# Patient Record
Sex: Female | Born: 2000 | Race: Black or African American | Hispanic: No | Marital: Single | Smoking: Never smoker
Health system: Southern US, Community
[De-identification: ages and names within clinical notes are randomized; demographics above are authoritative.]

## PROBLEM LIST (undated history)

## (undated) ENCOUNTER — Emergency Department (HOSPITAL_BASED_OUTPATIENT_CLINIC_OR_DEPARTMENT_OTHER): Admission: EM | Payer: Medicaid Other | Source: Home / Self Care

## (undated) ENCOUNTER — Inpatient Hospital Stay (HOSPITAL_COMMUNITY): Payer: Self-pay

## (undated) ENCOUNTER — Ambulatory Visit: Payer: Self-pay | Source: Home / Self Care

## (undated) DIAGNOSIS — O24419 Gestational diabetes mellitus in pregnancy, unspecified control: Secondary | ICD-10-CM

## (undated) DIAGNOSIS — W3400XA Accidental discharge from unspecified firearms or gun, initial encounter: Secondary | ICD-10-CM

## (undated) DIAGNOSIS — Z789 Other specified health status: Secondary | ICD-10-CM

## (undated) HISTORY — PX: NO PAST SURGERIES: SHX2092

---
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
## 2019-01-04 DIAGNOSIS — Z5181 Encounter for therapeutic drug level monitoring: Secondary | ICD-10-CM | POA: Diagnosis not present

## 2019-01-09 DIAGNOSIS — Z5181 Encounter for therapeutic drug level monitoring: Secondary | ICD-10-CM | POA: Diagnosis not present

## 2019-01-16 DIAGNOSIS — Z5181 Encounter for therapeutic drug level monitoring: Secondary | ICD-10-CM | POA: Diagnosis not present

## 2019-01-18 ENCOUNTER — Encounter (HOSPITAL_BASED_OUTPATIENT_CLINIC_OR_DEPARTMENT_OTHER): Payer: Self-pay | Admitting: *Deleted

## 2019-01-18 ENCOUNTER — Emergency Department (HOSPITAL_BASED_OUTPATIENT_CLINIC_OR_DEPARTMENT_OTHER)
Admission: EM | Admit: 2019-01-18 | Discharge: 2019-01-19 | Disposition: A | Discharge status: Home / Self Care | Payer: Medicaid Other | Attending: Emergency Medicine | Admitting: Emergency Medicine

## 2019-01-18 ENCOUNTER — Other Ambulatory Visit: Payer: Self-pay

## 2019-01-18 DIAGNOSIS — B9789 Other viral agents as the cause of diseases classified elsewhere: Secondary | ICD-10-CM | POA: Diagnosis not present

## 2019-01-18 DIAGNOSIS — J069 Acute upper respiratory infection, unspecified: Secondary | ICD-10-CM

## 2019-01-18 DIAGNOSIS — R05 Cough: Secondary | ICD-10-CM | POA: Diagnosis present

## 2019-01-18 NOTE — ED Triage Notes (Signed)
Pt c/o URi symptoms x 3 days  

## 2019-01-18 NOTE — ED Provider Notes (Signed)
[facility] Provider Note: Lowella Dell, MD, FACEP  CSN: [id number] MRN: [id number] ARRIVAL: 01/18/19 at 2326 ROOM: MH11/MH11   CHIEF COMPLAINT  URI   HISTORY OF PRESENT ILLNESS  01/18/19 11:44 PM Beth Valdez is a 18 y.o. female with a 3-day history of URI symptoms.  Specifically she has had a sore throat, cough, nasal congestion, and rhinorrhea.  The cough is nonproductive.  She denies shortness of breath, fever, body aches, loss of taste or smell, nausea, vomiting or diarrhea.  Symptoms are mild to moderate.  She has been taking Benadryl without improvement.  She is currently breast-feeding.   Past Medical History:  Diagnosis Date  . Gestational diabetes     Past Surgical History:  Procedure Laterality Date  . NO PAST SURGERIES      Family History  Problem Relation Age of Onset  . Hypertension Other   . Hypertension Maternal Grandfather     Social History   Tobacco Use  . Smoking status: Never Smoker  . Smokeless tobacco: Never Used  Substance Use Topics  . Alcohol use: Not Currently    Comment: socially  . Drug use: Not Currently    Types: Marijuana    Comment: Prior to pregnancy    Prior to Admission medications   Medication Sig Start Date End Date Taking? Authorizing Provider  dextromethorphan 15 MG/5ML syrup Take 10 mLs (30 mg total) by mouth 4 (four) times daily as needed for cough. 01/19/19   Lashonda Sonneborn, MD  ibuprofen (ADVIL) 800 MG tablet Take 1 tablet (800 mg total) by mouth every 8 (eight) hours as needed for fever or moderate pain. 01/19/19   Jozey Janco, Jonny Ruiz, MD    Allergies Patient has no known allergies.   REVIEW OF SYSTEMS  Negative except as noted here or in the History of Present Illness.   PHYSICAL EXAMINATION  Initial Vital Signs Blood pressure 121/79, pulse 89, temperature 98.5 F (36.9 C), resp. rate 16, height 5\' 8"  (1.727 m), weight 120.2 kg, last menstrual period 01/18/2019, SpO2 100 %, currently breastfeeding.   Examination General: Well-developed, well-nourished female in no acute distress; appearance consistent with age of record HENT: normocephalic; atraumatic Eyes: pupils equal, round and reactive to light; extraocular muscles intact Neck: supple Heart: regular rate and rhythm Lungs: clear to auscultation bilaterally Abdomen: soft; nondistended; nontender;  bowel sounds present Extremities: No deformity; full range of motion Neurologic: Awake, alert and oriented; motor function intact in all extremities and symmetric; no facial droop Skin: Warm and dry Psychiatric: Normal mood and affect   RESULTS  Summary of this visit's results, reviewed and interpreted by myself:   EKG Interpretation  Date/Time:    Ventricular Rate:    PR Interval:    QRS Duration:   QT Interval:    QTC Calculation:   R Axis:     Text Interpretation:        Laboratory Studies: No results found for this or any previous visit (from the past 24 hour(s)). Imaging Studies: No results found.  ED COURSE and MDM  Nursing notes, initial and subsequent vitals signs, including pulse oximetry, reviewed and interpreted by myself.  Vitals:   01/18/19 2336 01/18/19 2338  BP:  121/79  Pulse:  89  Resp:  16  Temp:  98.5 F (36.9 C)  SpO2:  100%  Weight: 120.2 kg   Height: 5\' 8"  (1.727 m)    Medications  ibuprofen (ADVIL) tablet 800 mg (has no administration in time range)  guaiFENesin (  ROBITUSSIN) 100 MG/5ML solution 200 mg (has no administration in time range)    We will prescribe ibuprofen for sore throat.  Dextromethorphan appears safe in lactation.  PROCEDURES  Procedures   ED DIAGNOSES     ICD-10-CM   1. Viral URI with cough  J06.9        Moustafa Mossa, MD 01/19/19 5929

## 2019-01-19 DIAGNOSIS — B9789 Other viral agents as the cause of diseases classified elsewhere: Secondary | ICD-10-CM | POA: Diagnosis not present

## 2019-01-19 DIAGNOSIS — J069 Acute upper respiratory infection, unspecified: Secondary | ICD-10-CM | POA: Diagnosis not present

## 2019-01-19 MED ORDER — DEXTROMETHORPHAN HBR 15 MG/5ML PO SYRP: 10.0000 mL | ORAL_SOLUTION | Freq: Four times a day (QID) | ORAL | 0 refills | Status: DC | PRN

## 2019-01-19 MED ORDER — IBUPROFEN 800 MG PO TABS: 800.0000 mg | ORAL_TABLET | Freq: Three times a day (TID) | ORAL | 0 refills | Status: DC | PRN

## 2019-01-19 MED ORDER — GUAIFENESIN 100 MG/5ML PO SOLN
10.0000 mL | Freq: Once | ORAL | Status: AC
  Administered 2019-01-19: 200 mg via ORAL
  Filled 2019-01-19: qty 10

## 2019-01-19 MED ORDER — IBUPROFEN 800 MG PO TABS
800.0000 mg | ORAL_TABLET | Freq: Once | ORAL | Status: AC
  Administered 2019-01-19: 800 mg via ORAL
  Filled 2019-01-19: qty 1

## 2019-01-23 DIAGNOSIS — Z5181 Encounter for therapeutic drug level monitoring: Secondary | ICD-10-CM | POA: Diagnosis not present

## 2019-01-30 DIAGNOSIS — Z5181 Encounter for therapeutic drug level monitoring: Secondary | ICD-10-CM | POA: Diagnosis not present

## 2019-02-06 DIAGNOSIS — Z5181 Encounter for therapeutic drug level monitoring: Secondary | ICD-10-CM | POA: Diagnosis not present

## 2019-02-13 DIAGNOSIS — Z5181 Encounter for therapeutic drug level monitoring: Secondary | ICD-10-CM | POA: Diagnosis not present

## 2019-02-27 DIAGNOSIS — Z5181 Encounter for therapeutic drug level monitoring: Secondary | ICD-10-CM | POA: Diagnosis not present

## 2019-03-02 ENCOUNTER — Other Ambulatory Visit: Payer: Self-pay

## 2019-03-02 ENCOUNTER — Ambulatory Visit (INDEPENDENT_AMBULATORY_CARE_PROVIDER_SITE_OTHER): Payer: Medicaid Other

## 2019-03-02 ENCOUNTER — Other Ambulatory Visit (HOSPITAL_COMMUNITY)
Admission: RE | Admit: 2019-03-02 | Discharge: 2019-03-02 | Disposition: A | Discharge status: Home / Self Care | Payer: Medicaid Other | Source: Ambulatory Visit | Attending: Family Medicine | Admitting: Family Medicine

## 2019-03-02 DIAGNOSIS — N898 Other specified noninflammatory disorders of vagina: Secondary | ICD-10-CM | POA: Diagnosis not present

## 2019-03-02 NOTE — Progress Notes (Signed)
Pt here today for self-swab. Pt reports hx of gonorrhea and chlamydia. Reports recent hx of thick, white discharge and current vaginal itching. Self-swab instructions given and specimen obtained. Explained to pt we will only call with abnormal results.  Pt expresses interest in beginning birth control. Has not been seen by a provider since delivery August 2020. Encouraged pt to make an appt with a provider to discuss birth control options before leaving the office. Handouts given to patient with some options to consider for birth control prior to appt.  Fleet Contras RN 03/02/19

## 2019-03-02 NOTE — Progress Notes (Signed)
Patient seen and assessed by nursing staff during this encounter. I have reviewed the chart and agree with the documentation and plan.  Ladye Macnaughton, MD 03/02/2019 3:59 PM    

## 2019-03-03 LAB — CERVICOVAGINAL ANCILLARY ONLY
Bacterial Vaginitis (gardnerella): POSITIVE — AB
Candida Glabrata: NEGATIVE
Candida Vaginitis: NEGATIVE
Chlamydia: NEGATIVE
Comment: NEGATIVE
Comment: NEGATIVE
Comment: NEGATIVE
Comment: NEGATIVE
Comment: NEGATIVE
Comment: NORMAL
Neisseria Gonorrhea: NEGATIVE
Trichomonas: NEGATIVE

## 2019-03-04 ENCOUNTER — Other Ambulatory Visit: Payer: Self-pay | Admitting: Obstetrics and Gynecology

## 2019-03-04 MED ORDER — METRONIDAZOLE 500 MG PO TABS: 500.0000 mg | ORAL_TABLET | Freq: Two times a day (BID) | ORAL | 0 refills | Status: DC

## 2019-03-06 DIAGNOSIS — Z5181 Encounter for therapeutic drug level monitoring: Secondary | ICD-10-CM | POA: Diagnosis not present

## 2019-03-13 DIAGNOSIS — Z5181 Encounter for therapeutic drug level monitoring: Secondary | ICD-10-CM | POA: Diagnosis not present

## 2019-03-20 DIAGNOSIS — Z5181 Encounter for therapeutic drug level monitoring: Secondary | ICD-10-CM | POA: Diagnosis not present

## 2019-03-21 ENCOUNTER — Inpatient Hospital Stay (EMERGENCY_DEPARTMENT_HOSPITAL)
Admission: AD | Admit: 2019-03-21 | Discharge: 2019-03-21 | Disposition: A | Discharge status: Home / Self Care | Payer: Medicaid Other | Source: Home / Self Care | Attending: Family Medicine | Admitting: Family Medicine

## 2019-03-21 ENCOUNTER — Other Ambulatory Visit: Payer: Self-pay

## 2019-03-21 ENCOUNTER — Emergency Department (HOSPITAL_COMMUNITY)
Admission: EM | Admit: 2019-03-21 | Discharge: 2019-03-21 | Discharge status: Against Medical Advice | Payer: Medicaid Other | Attending: Emergency Medicine | Admitting: Emergency Medicine

## 2019-03-21 ENCOUNTER — Encounter (HOSPITAL_COMMUNITY): Payer: Self-pay | Admitting: Family Medicine

## 2019-03-21 ENCOUNTER — Encounter (HOSPITAL_COMMUNITY): Payer: Self-pay | Admitting: Emergency Medicine

## 2019-03-21 DIAGNOSIS — Z3A01 Less than 8 weeks gestation of pregnancy: Secondary | ICD-10-CM | POA: Diagnosis not present

## 2019-03-21 DIAGNOSIS — R109 Unspecified abdominal pain: Secondary | ICD-10-CM

## 2019-03-21 DIAGNOSIS — R7303 Prediabetes: Secondary | ICD-10-CM | POA: Diagnosis not present

## 2019-03-21 DIAGNOSIS — O26891 Other specified pregnancy related conditions, first trimester: Secondary | ICD-10-CM | POA: Insufficient documentation

## 2019-03-21 DIAGNOSIS — Z3201 Encounter for pregnancy test, result positive: Secondary | ICD-10-CM | POA: Diagnosis not present

## 2019-03-21 LAB — URINALYSIS, ROUTINE W REFLEX MICROSCOPIC
Bilirubin Urine: NEGATIVE
Glucose, UA: NEGATIVE mg/dL
Hgb urine dipstick: NEGATIVE
Ketones, ur: NEGATIVE mg/dL
Leukocytes,Ua: NEGATIVE
Nitrite: NEGATIVE
Protein, ur: NEGATIVE mg/dL
Specific Gravity, Urine: 1.027 (ref 1.005–1.030)
pH: 5 (ref 5.0–8.0)

## 2019-03-21 LAB — COMPREHENSIVE METABOLIC PANEL
ALT: 18 U/L (ref 0–44)
AST: 17 U/L (ref 15–41)
Albumin: 3.7 g/dL (ref 3.5–5.0)
Alkaline Phosphatase: 71 U/L (ref 38–126)
Anion gap: 10 (ref 5–15)
BUN: 8 mg/dL (ref 6–20)
CO2: 24 mmol/L (ref 22–32)
Calcium: 9.2 mg/dL (ref 8.9–10.3)
Chloride: 104 mmol/L (ref 98–111)
Creatinine, Ser: 0.76 mg/dL (ref 0.44–1.00)
GFR calc Af Amer: >60 mL/min (ref >60)
GFR calc non Af Amer: >60 mL/min (ref >60)
Glucose, Bld: 116 mg/dL — ABNORMAL HIGH (ref 70–99)
Potassium: 4 mmol/L (ref 3.5–5.1)
Sodium: 138 mmol/L (ref 135–145)
Total Bilirubin: 0.3 mg/dL (ref 0.3–1.2)
Total Protein: 7.4 g/dL (ref 6.5–8.1)

## 2019-03-21 LAB — I-STAT BETA HCG BLOOD, ED (MC, WL, AP ONLY): I-stat hCG, quantitative: 18.4 m[IU]/mL — ABNORMAL HIGH (ref <5)

## 2019-03-21 LAB — CBC
HCT: 43.3 % (ref 36.0–46.0)
Hemoglobin: 12.9 g/dL (ref 12.0–15.0)
MCH: 20.9 pg — ABNORMAL LOW (ref 26.0–34.0)
MCHC: 29.8 g/dL — ABNORMAL LOW (ref 30.0–36.0)
MCV: 70.1 fL — ABNORMAL LOW (ref 80.0–100.0)
Platelets: 399 10*3/uL (ref 150–400)
RBC: 6.18 MIL/uL — ABNORMAL HIGH (ref 3.87–5.11)
RDW: 15.7 % — ABNORMAL HIGH (ref 11.5–15.5)
WBC: 8.1 10*3/uL (ref 4.0–10.5)
nRBC: 0 % (ref 0.0–0.2)

## 2019-03-21 LAB — LIPASE, BLOOD: Lipase: 21 U/L (ref 11–51)

## 2019-03-21 NOTE — ED Provider Notes (Addendum)
[facility] EMERGENCY DEPARTMENT Provider Note   CSN: [id number] Arrival date & time: 03/21/19  1353     History Chief Complaint  Patient presents with  . Abdominal Pain    Beth Valdez is a 19 y.o. female.  HPI     Past Medical History:  Diagnosis Date  . Gestational diabetes     Patient Active Problem List   Diagnosis Date Noted  . Indication for care in labor or delivery 09/30/2018  . Gestational diabetes mellitus (GDM) affecting pregnancy 04/20/2018  . Prediabetes 04/01/2018  . Supervision of normal first teen pregnancy 03/22/2018  . Chlamydia trachomatis infection during pregnancy in first trimester, antepartum 03/02/2018    Past Surgical History:  Procedure Laterality Date  . NO PAST SURGERIES       OB History    Gravida  2   Para  1   Term  1   Preterm      AB      Living  1     SAB      TAB      Ectopic      Multiple  0   Live Births  1           Family History  Problem Relation Age of Onset  . Hypertension Other   . Hypertension Maternal Grandfather     Social History   Tobacco Use  . Smoking status: Never Smoker  . Smokeless tobacco: Never Used  Substance Use Topics  . Alcohol use: Not Currently    Comment: socially  . Drug use: Not Currently    Types: Marijuana    Comment: Prior to pregnancy    Home Medications Prior to Admission medications   Not on File    Allergies    Patient has no known allergies.  Review of Systems   Review of Systems  Physical Exam Updated Vital Signs BP 110/63 (BP Location: Right Arm)   Pulse 92   Temp 98.1 F (36.7 C) (Oral)   Resp 16   Ht 5\' 8"  (1.727 m)   Wt 117.9 kg   LMP 02/17/2019   SpO2 100%   BMI 39.53 kg/m   Physical Exam  ED Results / Procedures / Treatments   Labs (all labs ordered are listed, but only abnormal results are displayed) Labs Reviewed  COMPREHENSIVE METABOLIC PANEL - Abnormal; Notable for the following components:   Result Value   Glucose, Bld 116 (*)    All other components within normal limits  CBC - Abnormal; Notable for the following components:   RBC 6.18 (*)    MCV 70.1 (*)    MCH 20.9 (*)    MCHC 29.8 (*)    RDW 15.7 (*)    All other components within normal limits  I-STAT BETA HCG BLOOD, ED ([facility], [facility], [facility] ONLY) - Abnormal; Notable for the following components:   I-stat hCG, quantitative 18.4 (*)    All other components within normal limits  LIPASE, BLOOD  URINALYSIS, ROUTINE W REFLEX MICROSCOPIC    EKG None  Radiology No results found.  Procedures Procedures (including critical care time)  Medications Ordered in ED Medications - No data to display  ED Course  I have reviewed the triage vital signs and the nursing notes.  Pertinent labs & imaging results that were available during my care of the patient were reviewed by me and considered in my medical decision making (see chart for details).  Clinical Course as of Feb  08 O'[place] Mar 21, 2019  1602 Patient signed out AMA prior to me being able to see the patient.   [KM]    Clinical Course User Index [KM] Kristine Royal   MDM Rules/Calculators/A&P                       Final Clinical Impression(s) / ED Diagnoses Final diagnoses:  Abdominal pain, unspecified abdominal location    Rx / DC Orders ED Discharge Orders    None       Kristine Royal 03/21/19 1604    Maudie Flakes, MD 03/24/19 0131    Alveria Apley, PA-C 03/27/19 0815    Maudie Flakes, MD 03/30/19 [phone]

## 2019-03-21 NOTE — MAU Provider Note (Signed)
First Provider Initiated Contact with Patient 03/21/19 2105     S Ms. Beth Valdez is a 19 y.o. [id number] pregnant female who presents to MAU today requesting confirmation of pregnancy. She was seen in [facility] ED earlier today and blood test positive for pregnancy but these results were reportedly not shared with the patient. She reports some nausea in the mornings but otherwise denies vaginal bleeding, abdominal pain, urinary symptoms, vaginal discharge or any other concern.   O BP 122/63 (BP Location: Right Arm)   Pulse 88   Temp 98.5 F (36.9 C) (Oral)   Resp 20   Ht 5\' 8"  (1.727 m)   Wt 119.3 kg   LMP 02/17/2019   BMI 39.99 kg/m  Physical Exam CONSTITUTIONAL: Well-developed, well-nourished female in no acute distress.  HEENT:  Normocephalic, atraumatic. External right and left ear normal. No scleral icterus.  NECK: Normal range of motion SKIN: No rash noted. Not diaphoretic. No erythema. No pallor. MUSCULOSKELETAL: Normal range of motion. No edema noted. NEUROLOGIC: Alert and oriented to person, place, and time. Normal muscle tone coordination. No cranial nerve deficit noted. PSYCHIATRIC: Normal mood and affect. Normal behavior. Normal judgment and thought content. CARDIOVASCULAR: Normal heart rate noted RESPIRATORY: Effort  normal, no problems with respiration noted ABDOMEN: Soft, non-tender.  A Pregnant female at approx [redacted]w[redacted]d by LMP. Medical screening exam complete Early pregnancy.   P Discharge from MAU in stable condition Advised to f/u with [facility] as she did with last pregnancy should she choose to continue with the pregnancy. Concern for bleeding, pain or any other concern.  Warning signs for worsening condition that would warrant emergency follow-up discussed Patient may return to MAU as needed for pregnancy related complaints  Kamylah Manzo, [redacted]w[redacted]d, MD 03/21/2019 9:11 PM

## 2019-03-21 NOTE — ED Notes (Signed)
Pt requests to leave. Pt advised that she has not received her test results or seen a provider at this time. Pt verbalizes understanding. Education provided on the risks of leaving AMA. Pt verbalizes understanding of these risks. Pt is alert and oriented x4 and ambulatory at time of leaving.

## 2019-03-21 NOTE — MAU Note (Signed)
PT SAYS  FEELS NAUSEA, VOIDING  A LOT , MANY BM'S, . HPT - YESTERDAY - 1 NEG  AND 2 POSITIVE.  NO BC. LAST SEX-  2 WEEKS AG.   SHE ALSO DID HPT ON 17TH - WAS NEG

## 2019-03-21 NOTE — ED Triage Notes (Signed)
Pt endorses lower abd pain for a week and nausea.

## 2019-04-05 ENCOUNTER — Ambulatory Visit: Payer: Medicaid Other | Admitting: Obstetrics & Gynecology

## 2019-04-24 ENCOUNTER — Ambulatory Visit: Payer: Medicaid Other | Admitting: Family Medicine

## 2019-04-26 ENCOUNTER — Encounter: Payer: Self-pay | Admitting: Family Medicine

## 2019-05-24 ENCOUNTER — Ambulatory Visit: Payer: Medicaid Other | Admitting: Family Medicine

## 2019-08-17 DIAGNOSIS — Z419 Encounter for procedure for purposes other than remedying health state, unspecified: Secondary | ICD-10-CM | POA: Diagnosis not present

## 2019-08-29 ENCOUNTER — Other Ambulatory Visit: Payer: Self-pay

## 2019-08-29 ENCOUNTER — Ambulatory Visit (HOSPITAL_COMMUNITY)
Admission: EM | Admit: 2019-08-29 | Discharge: 2019-08-29 | Disposition: A | Discharge status: Home / Self Care | Payer: Medicaid Other

## 2019-08-29 DIAGNOSIS — S52502D Unspecified fracture of the lower end of left radius, subsequent encounter for closed fracture with routine healing: Secondary | ICD-10-CM | POA: Diagnosis not present

## 2019-08-29 DIAGNOSIS — S52502A Unspecified fracture of the lower end of left radius, initial encounter for closed fracture: Secondary | ICD-10-CM | POA: Diagnosis not present

## 2019-08-29 DIAGNOSIS — S62101A Fracture of unspecified carpal bone, right wrist, initial encounter for closed fracture: Secondary | ICD-10-CM

## 2019-08-29 HISTORY — DX: Fracture of unspecified carpal bone, right wrist, initial encounter for closed fracture: S62.101A

## 2019-08-29 NOTE — ED Notes (Signed)
Pt eval from waiting room 2/2 pt crying hysterically, saying "it hurts". Pt reports she fell PTA, denies head injury/LOC, c/o right wrist pain. Fingers warm to touch, capillary refill brisk, +2 radial pulse. Ice applied and comfort measures/reassurance given. Ice water given. Per H. Wieters, pt can return to waiting room and be evaluated/tx in order of time of arrival or go to [facility]. Pt chose to go to [facility] 2/2 wait time.

## 2019-08-30 DIAGNOSIS — S52502D Unspecified fracture of the lower end of left radius, subsequent encounter for closed fracture with routine healing: Secondary | ICD-10-CM | POA: Diagnosis not present

## 2019-08-31 ENCOUNTER — Encounter (HOSPITAL_BASED_OUTPATIENT_CLINIC_OR_DEPARTMENT_OTHER): Payer: Self-pay | Admitting: Orthopedic Surgery

## 2019-08-31 ENCOUNTER — Other Ambulatory Visit: Payer: Self-pay

## 2019-08-31 NOTE — Progress Notes (Signed)
Spoke w/ via phone for pre-op interview---pt Lab needs dos----   Urine poct            Lab results------none COVID test ------09-02-2019 at 1130 am Arrive at -------930 am 09-05-2019 NPO after MN NO Solid Food.  Clear liquids from MN until---730 am then npo Medications to take morning of surgery -----none Diabetic medication -----n/a Patient Special Instructions -----none Pre-Op special Istructions -----none Patient verbalized understanding of instructions that were given at this phone interview. Patient denies shortness of breath, chest pain, fever, cough a this phone interview.

## 2019-09-02 ENCOUNTER — Other Ambulatory Visit (HOSPITAL_COMMUNITY)
Admission: RE | Admit: 2019-09-02 | Discharge: 2019-09-02 | Disposition: A | Discharge status: Home / Self Care | Payer: Medicaid Other | Source: Ambulatory Visit | Attending: Orthopedic Surgery | Admitting: Orthopedic Surgery

## 2019-09-02 DIAGNOSIS — Z20822 Contact with and (suspected) exposure to covid-19: Secondary | ICD-10-CM | POA: Diagnosis not present

## 2019-09-02 LAB — SARS CORONAVIRUS 2 (TAT 6-24 HRS): SARS Coronavirus 2: NEGATIVE

## 2019-09-04 NOTE — Anesthesia Preprocedure Evaluation (Addendum)
Anesthesia Evaluation  Patient identified by MRN, date of birth, ID band Patient awake    Reviewed: Allergy & Precautions, NPO status , Patient's Chart, lab work & pertinent test results  Airway Mallampati: I  TM Distance: >3 FB Neck ROM: Full    Dental   Pulmonary neg pulmonary ROS,    Pulmonary exam normal        Cardiovascular Exercise Tolerance: Good negative cardio ROS Normal cardiovascular exam     Neuro/Psych negative neurological ROS  negative psych ROS   GI/Hepatic negative GI ROS, Neg liver ROS,   Endo/Other  diabetes  Renal/GU negative Renal ROS     Musculoskeletal negative musculoskeletal ROS (+)   Abdominal (+) + obese,   Peds  Hematology negative hematology ROS (+)   Anesthesia Other Findings   Reproductive/Obstetrics                            Anesthesia Physical Anesthesia Plan  ASA: II  Anesthesia Plan: Regional   Post-op Pain Management:    Induction:   PONV Risk Score and Plan: 3 and Treatment may vary due to age or medical condition and Midazolam  Airway Management Planned: Natural Airway and Nasal Cannula  Additional Equipment: None  Intra-op Plan:   Post-operative Plan:   Informed Consent: I have reviewed the patients History and Physical, chart, labs and discussed the procedure including the risks, benefits and alternatives for the proposed anesthesia with the patient or authorized representative who has indicated his/her understanding and acceptance.     Dental advisory given  Plan Discussed with: CRNA and Surgeon  Anesthesia Plan Comments: (R supra clavicular block w MAC)       Anesthesia Quick Evaluation

## 2019-09-04 NOTE — H&P (Signed)
PREOPERATIVE H&P  Chief Complaint: RIGHT WRIST FRACTURE  HPI: Beth Valdez is a 19 y.o. female who is scheduled for OPEN REDUCTION INTERNAL FIXATION (ORIF) WRIST FRACTURE.   Patient has a past medical history significant for gestational diabetes.   Patient was seen at [facility] on 08/29/2019 after falling onto her right outstretched hand. X-rays showed distal radius fracture. She was sent to Dr. Eulah Pont for surgical discussion.  Symptoms are rated as moderate to severe, and have been worsening.  This is significantly impairing activities of daily living.    Please see clinic note for further details on this patient's care.    She has elected for surgical management.   Past Medical History:  Diagnosis Date  . History of gestational diabetes 09/30/2018  . Right wrist fracture 08/29/2019   after fall   Past Surgical History:  Procedure Laterality Date  . NO PAST SURGERIES     Social History   Socioeconomic History  . Marital status: Single    Spouse name: Not on file  . Number of children: Not on file  . Years of education: Not on file  . Highest education level: Not on file  Occupational History  . Not on file  Tobacco Use  . Smoking status: Never Smoker  . Smokeless tobacco: Never Used  Vaping Use  . Vaping Use: Never used  Substance and Sexual Activity  . Alcohol use: Not Currently  . Drug use: Not Currently    Types: Marijuana    Comment: marijuana last used 08-24-2019  . Sexual activity: Yes    Partners: Male    Birth control/protection: None  Other Topics Concern  . 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 Difficulty of Paying Living Expenses:   Food Insecurity:   . Worried About Programme researcher, broadcasting/film/video in the Last Year:   . Barista in the Last Year:   Transportation Needs:   . Freight forwarder (Medical):   Marland Kitchen Lack of Transportation  (Non-Medical):   Physical Activity:   . Days of Exercise per Week:   . Minutes of Exercise per Session:   Stress:   . Feeling of Stress :   Social Connections:   . Frequency of Communication with Friends and Family:   . Frequency of Social Gatherings with Friends and Family:   . Attends Religious Services:   . Active Member of Clubs or Organizations:   . Attends Banker Meetings:   Marland Kitchen Marital Status:    Family History  Problem Relation Age of Onset  . Hypertension Other   . Hypertension Maternal Grandfather    No Known Allergies Prior to Admission medications   Medication Sig Start Date End Date Taking? Authorizing Provider  acetaminophen (TYLENOL) 500 MG tablet Take 1,000 mg by mouth every 6 (six) hours as needed.   Yes [provider]    ROS: All other systems have been reviewed and were otherwise negative with the exception of those mentioned in the HPI and as above.  Physical Exam: General: Alert, no acute distress Cardiovascular: No pedal edema Respiratory: No cyanosis, no use of accessory musculature GI: No organomegaly, abdomen is soft and non-tender Skin: No lesions in the area of chief complaint Neurologic: Sensation intact distally Psychiatric: Patient is competent for consent with normal mood and affect Lymphatic: No axillary or cervical lymphadenopathy  MUSCULOSKELETAL:  Right upper extremity: Splint CDI. Skin intact though cannot assess fully beneath splint. Nontender to palpation proximally. Painless passive motion of fingers. Well perfused digits.   Imaging: X-rays demonstrate displaced distal radius fracture with dorsal articular involvement with multiple pieces  Assessment: RIGHT WRIST FRACTURE  Plan: Plan for Procedure(s): OPEN REDUCTION INTERNAL FIXATION (ORIF) WRIST FRACTURE  The risks benefits and alternatives were discussed with the patient including but not limited to the risks of nonoperative treatment, versus surgical  intervention including infection, bleeding, nerve injury,  blood clots, cardiopulmonary complications, morbidity, mortality, among others, and they were willing to proceed.   The patient acknowledged the explanation, agreed to proceed with the plan and consent was signed.   Operative Plan: ORIF right wrist fracture Discharge Medications: Norco DVT Prophylaxis: None Physical Therapy: +/- outpatient OT Special Discharge needs: Splint. Sling for comfort.    Vernetta Honey, PA-C  09/04/2019 5:30 PM

## 2019-09-05 ENCOUNTER — Ambulatory Visit (HOSPITAL_BASED_OUTPATIENT_CLINIC_OR_DEPARTMENT_OTHER): Payer: Medicaid Other | Admitting: Registered Nurse

## 2019-09-05 ENCOUNTER — Encounter (HOSPITAL_BASED_OUTPATIENT_CLINIC_OR_DEPARTMENT_OTHER)
Admission: RE | Disposition: A | Discharge status: Home / Self Care | Payer: Self-pay | Source: Home / Self Care | Attending: Orthopedic Surgery

## 2019-09-05 ENCOUNTER — Ambulatory Visit (HOSPITAL_BASED_OUTPATIENT_CLINIC_OR_DEPARTMENT_OTHER)
Admission: RE | Admit: 2019-09-05 | Discharge: 2019-09-05 | Disposition: A | Discharge status: Home / Self Care | Payer: Medicaid Other | Attending: Orthopedic Surgery | Admitting: Orthopedic Surgery

## 2019-09-05 ENCOUNTER — Encounter (HOSPITAL_BASED_OUTPATIENT_CLINIC_OR_DEPARTMENT_OTHER): Payer: Self-pay | Admitting: Orthopedic Surgery

## 2019-09-05 ENCOUNTER — Other Ambulatory Visit: Payer: Self-pay

## 2019-09-05 DIAGNOSIS — E119 Type 2 diabetes mellitus without complications: Secondary | ICD-10-CM | POA: Insufficient documentation

## 2019-09-05 DIAGNOSIS — S52501A Unspecified fracture of the lower end of right radius, initial encounter for closed fracture: Secondary | ICD-10-CM | POA: Diagnosis not present

## 2019-09-05 DIAGNOSIS — S52502A Unspecified fracture of the lower end of left radius, initial encounter for closed fracture: Secondary | ICD-10-CM | POA: Diagnosis not present

## 2019-09-05 DIAGNOSIS — S62101A Fracture of unspecified carpal bone, right wrist, initial encounter for closed fracture: Secondary | ICD-10-CM | POA: Diagnosis not present

## 2019-09-05 DIAGNOSIS — S52571A Other intraarticular fracture of lower end of right radius, initial encounter for closed fracture: Secondary | ICD-10-CM | POA: Insufficient documentation

## 2019-09-05 DIAGNOSIS — W19XXXA Unspecified fall, initial encounter: Secondary | ICD-10-CM | POA: Diagnosis not present

## 2019-09-05 HISTORY — PX: ORIF WRIST FRACTURE: SHX2133

## 2019-09-05 LAB — POCT PREGNANCY, URINE: Preg Test, Ur: NEGATIVE

## 2019-09-05 SURGERY — OPEN REDUCTION INTERNAL FIXATION (ORIF) WRIST FRACTURE
Anesthesia: Regional | Site: Wrist | Laterality: Right

## 2019-09-05 MED ORDER — ACETAMINOPHEN 500 MG PO TABS
1000.0000 mg | ORAL_TABLET | Freq: Three times a day (TID) | ORAL | 0 refills | Status: AC
Start: 2019-09-05 — End: 2019-09-19

## 2019-09-05 MED ORDER — LACTATED RINGERS IV SOLN: INTRAVENOUS | Status: DC

## 2019-09-05 MED ORDER — MIDAZOLAM HCL 2 MG/2ML IJ SOLN
INTRAMUSCULAR | Status: AC
  Filled 2019-09-05: qty 2

## 2019-09-05 MED ORDER — OXYCODONE HCL 5 MG PO TABS: ORAL_TABLET | ORAL | 0 refills | Status: AC

## 2019-09-05 MED ORDER — FENTANYL CITRATE (PF) 100 MCG/2ML IJ SOLN
INTRAMUSCULAR | Status: AC
  Filled 2019-09-05: qty 2

## 2019-09-05 MED ORDER — ONDANSETRON HCL 4 MG/2ML IJ SOLN
INTRAMUSCULAR | Status: DC | PRN
  Administered 2019-09-05: 4 mg via INTRAVENOUS

## 2019-09-05 MED ORDER — ONDANSETRON HCL 4 MG/2ML IJ SOLN: 4.0000 mg | Freq: Once | INTRAMUSCULAR | Status: DC | PRN

## 2019-09-05 MED ORDER — GLYCOPYRROLATE 0.2 MG/ML IJ SOLN
INTRAMUSCULAR | Status: DC | PRN
  Administered 2019-09-05: .1 mg via INTRAVENOUS

## 2019-09-05 MED ORDER — DEXAMETHASONE SODIUM PHOSPHATE 10 MG/ML IJ SOLN
INTRAMUSCULAR | Status: AC
  Filled 2019-09-05: qty 1

## 2019-09-05 MED ORDER — PROPOFOL 500 MG/50ML IV EMUL
INTRAVENOUS | Status: AC
  Filled 2019-09-05: qty 50

## 2019-09-05 MED ORDER — FENTANYL CITRATE (PF) 100 MCG/2ML IJ SOLN
INTRAMUSCULAR | Status: DC | PRN
  Administered 2019-09-05 (×2): 50 ug via INTRAVENOUS

## 2019-09-05 MED ORDER — ONDANSETRON HCL 4 MG/2ML IJ SOLN
INTRAMUSCULAR | Status: AC
  Filled 2019-09-05: qty 2

## 2019-09-05 MED ORDER — DROPERIDOL 2.5 MG/ML IJ SOLN: 0.6250 mg | Freq: Once | INTRAMUSCULAR | Status: DC | PRN

## 2019-09-05 MED ORDER — LIDOCAINE HCL (CARDIAC) PF 100 MG/5ML IV SOSY
PREFILLED_SYRINGE | INTRAVENOUS | Status: DC | PRN
  Administered 2019-09-05: 50 mg via INTRAVENOUS

## 2019-09-05 MED ORDER — BUPIVACAINE-EPINEPHRINE (PF) 0.5% -1:200000 IJ SOLN
INTRAMUSCULAR | Status: DC | PRN
Start: 2019-09-05 — End: 2019-09-05
  Administered 2019-09-05: 30 mL via PERINEURAL

## 2019-09-05 MED ORDER — LIDOCAINE 2% (20 MG/ML) 5 ML SYRINGE
INTRAMUSCULAR | Status: AC
  Filled 2019-09-05: qty 5

## 2019-09-05 MED ORDER — PROPOFOL 10 MG/ML IV BOLUS
INTRAVENOUS | Status: AC
  Filled 2019-09-05: qty 20

## 2019-09-05 MED ORDER — FENTANYL CITRATE (PF) 100 MCG/2ML IJ SOLN
100.0000 ug | Freq: Once | INTRAMUSCULAR | Status: AC
  Administered 2019-09-05: 100 ug via INTRAVENOUS

## 2019-09-05 MED ORDER — GLYCOPYRROLATE PF 0.2 MG/ML IJ SOSY
PREFILLED_SYRINGE | INTRAMUSCULAR | Status: AC
  Filled 2019-09-05: qty 1

## 2019-09-05 MED ORDER — ACETAMINOPHEN 10 MG/ML IV SOLN: 1000.0000 mg | Freq: Once | INTRAVENOUS | Status: DC | PRN

## 2019-09-05 MED ORDER — PROPOFOL 500 MG/50ML IV EMUL
INTRAVENOUS | Status: DC | PRN
  Administered 2019-09-05: 50 ug/kg/min via INTRAVENOUS

## 2019-09-05 MED ORDER — MIDAZOLAM HCL 2 MG/2ML IJ SOLN
2.0000 mg | Freq: Once | INTRAMUSCULAR | Status: AC
  Administered 2019-09-05: 2 mg via INTRAVENOUS

## 2019-09-05 MED ORDER — MIDAZOLAM HCL 5 MG/5ML IJ SOLN
INTRAMUSCULAR | Status: DC | PRN
  Administered 2019-09-05: 2 mg via INTRAVENOUS

## 2019-09-05 MED ORDER — ONDANSETRON HCL 4 MG PO TABS: 4.0000 mg | ORAL_TABLET | Freq: Three times a day (TID) | ORAL | 1 refills | Status: AC | PRN

## 2019-09-05 MED ORDER — CEFAZOLIN SODIUM-DEXTROSE 2-4 GM/100ML-% IV SOLN
INTRAVENOUS | Status: AC
  Filled 2019-09-05: qty 100

## 2019-09-05 MED ORDER — CEFAZOLIN SODIUM-DEXTROSE 2-4 GM/100ML-% IV SOLN
2.0000 g | INTRAVENOUS | Status: AC
  Administered 2019-09-05: 2 g via INTRAVENOUS

## 2019-09-05 MED ORDER — FENTANYL CITRATE (PF) 100 MCG/2ML IJ SOLN: 25.0000 ug | INTRAMUSCULAR | Status: DC | PRN

## 2019-09-05 MED ORDER — DEXAMETHASONE SODIUM PHOSPHATE 4 MG/ML IJ SOLN
INTRAMUSCULAR | Status: DC | PRN
  Administered 2019-09-05: 5 mg via INTRAVENOUS

## 2019-09-05 SURGICAL SUPPLY — 77 items
APL PRP STRL LF DISP 70% ISPRP (MISCELLANEOUS) ×1
BIT DRILL 2.2 SS TIBIAL (BIT) ×2 IMPLANT
BLADE SURG 15 STRL LF DISP TIS (BLADE) ×2 IMPLANT
BLADE SURG 15 STRL SS (BLADE) ×6
BNDG CMPR 9X4 STRL LF SNTH (GAUZE/BANDAGES/DRESSINGS) ×1
BNDG ELASTIC 3X5.8 VLCR STR LF (GAUZE/BANDAGES/DRESSINGS) ×2 IMPLANT
BNDG ELASTIC 4X5.8 VLCR STR LF (GAUZE/BANDAGES/DRESSINGS) ×3 IMPLANT
BNDG ESMARK 4X9 LF (GAUZE/BANDAGES/DRESSINGS) ×3 IMPLANT
CHLORAPREP W/TINT 26 (MISCELLANEOUS) ×3 IMPLANT
CLOSURE STERI-STRIP 1/2X4 (GAUZE/BANDAGES/DRESSINGS) ×1
CLSR STERI-STRIP ANTIMIC 1/2X4 (GAUZE/BANDAGES/DRESSINGS) ×2 IMPLANT
CORD BIPOLAR FORCEPS 12FT (ELECTRODE) ×3 IMPLANT
COVER BACK TABLE 60X90IN (DRAPES) ×3 IMPLANT
COVER WAND RF STERILE (DRAPES) ×3 IMPLANT
CUFF TOURN SGL QUICK 18X4 (TOURNIQUET CUFF) IMPLANT
CUFF TOURN SGL QUICK 24 (TOURNIQUET CUFF)
CUFF TRNQT CYL 24X4X16.5-23 (TOURNIQUET CUFF) IMPLANT
DECANTER SPIKE VIAL GLASS SM (MISCELLANEOUS) IMPLANT
DRAPE EXTREMITY T 121X128X90 (DISPOSABLE) ×3 IMPLANT
DRAPE IMP U-DRAPE 54X76 (DRAPES) ×3 IMPLANT
DRAPE OEC MINIVIEW 54X84 (DRAPES) ×3 IMPLANT
DRAPE SURG 17X23 STRL (DRAPES) IMPLANT
DRSG EMULSION OIL 3X3 NADH (GAUZE/BANDAGES/DRESSINGS) ×3 IMPLANT
GAUZE SPONGE 4X4 12PLY STRL (GAUZE/BANDAGES/DRESSINGS) ×3 IMPLANT
GAUZE XEROFORM 1X8 LF (GAUZE/BANDAGES/DRESSINGS) ×2 IMPLANT
GLOVE BIO SURGEON STRL SZ7.5 (GLOVE) ×6 IMPLANT
GLOVE BIOGEL PI IND STRL 8 (GLOVE) ×2 IMPLANT
GLOVE BIOGEL PI INDICATOR 8 (GLOVE) ×4
GOWN STRL REUS W/ TWL LRG LVL3 (GOWN DISPOSABLE) ×2 IMPLANT
GOWN STRL REUS W/TWL LRG LVL3 (GOWN DISPOSABLE) ×6
K-WIRE 1.6 (WIRE) ×6
K-WIRE FX5X1.6XNS BN SS (WIRE) ×2
KWIRE FX5X1.6XNS BN SS (WIRE) IMPLANT
NDL HYPO 25X1 1.5 SAFETY (NEEDLE) ×1 IMPLANT
NEEDLE HYPO 25X1 1.5 SAFETY (NEEDLE) ×3 IMPLANT
NS IRRIG 1000ML POUR BTL (IV SOLUTION) ×3 IMPLANT
PACK BASIN DAY SURGERY FS (CUSTOM PROCEDURE TRAY) ×3 IMPLANT
PAD CAST 3X4 CTTN HI CHSV (CAST SUPPLIES) IMPLANT
PAD CAST 4YDX4 CTTN HI CHSV (CAST SUPPLIES) ×1 IMPLANT
PADDING CAST ABS 4INX4YD NS (CAST SUPPLIES) ×2
PADDING CAST ABS COTTON 4X4 ST (CAST SUPPLIES) ×1 IMPLANT
PADDING CAST COTTON 3X4 STRL (CAST SUPPLIES) ×3
PADDING CAST COTTON 4X4 STRL (CAST SUPPLIES) ×3
PEG LOCKING SMOOTH 2.2X16 (Screw) ×2 IMPLANT
PEG LOCKING SMOOTH 2.2X18 (Peg) ×10 IMPLANT
PEG LOCKING SMOOTH 2.2X20 (Screw) ×2 IMPLANT
PLATE DVR CROSSLOCK STD RT (Plate) ×2 IMPLANT
SCREW LOCK 14X2.7X 3 LD TPR (Screw) IMPLANT
SCREW LOCK 16X2.7X 3 LD TPR (Screw) IMPLANT
SCREW LOCKING 2.7X14 (Screw) ×6 IMPLANT
SCREW LOCKING 2.7X16 (Screw) ×3 IMPLANT
SLEEVE SCD COMPRESS KNEE MED (MISCELLANEOUS) IMPLANT
SLING ARM FOAM STRAP MED (SOFTGOODS) ×2 IMPLANT
SLING ARM IMMOBILIZER MED (SOFTGOODS) ×2 IMPLANT
SPLINT FAST PLASTER 5X30 (CAST SUPPLIES) ×2
SPLINT PLASTER CAST FAST 5X30 (CAST SUPPLIES) IMPLANT
SPLINT PLASTER CAST XFAST 3X15 (CAST SUPPLIES) IMPLANT
SPLINT PLASTER CAST XFAST 4X15 (CAST SUPPLIES) IMPLANT
SPLINT PLASTER XTRA FAST SET 4 (CAST SUPPLIES)
SPLINT PLASTER XTRA FASTSET 3X (CAST SUPPLIES)
SPONGE LAP 4X18 RFD (DISPOSABLE) ×3 IMPLANT
SUCTION FRAZIER HANDLE 10FR (MISCELLANEOUS)
SUCTION TUBE FRAZIER 10FR DISP (MISCELLANEOUS) IMPLANT
SUT ETHILON 3 0 PS 1 (SUTURE) IMPLANT
SUT MON AB 2-0 CT1 36 (SUTURE) ×3 IMPLANT
SUT MON AB 4-0 PC3 18 (SUTURE) IMPLANT
SUT PROLENE 3 0 PS 2 (SUTURE) IMPLANT
SUT VIC AB 0 SH 27 (SUTURE) IMPLANT
SUT VIC AB 2-0 SH 27 (SUTURE)
SUT VIC AB 2-0 SH 27XBRD (SUTURE) IMPLANT
SUT VIC AB 3-0 FS2 27 (SUTURE) IMPLANT
SYR BULB EAR ULCER 3OZ GRN STR (SYRINGE) ×3 IMPLANT
SYR CONTROL 10ML LL (SYRINGE) ×3 IMPLANT
TOWEL OR 17X26 10 PK STRL BLUE (TOWEL DISPOSABLE) ×3 IMPLANT
TUBE CONNECTING 12'X1/4 (SUCTIONS)
TUBE CONNECTING 12X1/4 (SUCTIONS) IMPLANT
UNDERPAD 30X30 (UNDERPADS AND DIAPERS) ×3 IMPLANT

## 2019-09-05 NOTE — Op Note (Signed)
09/05/2019  12:37 PM  PATIENT:  Beth Valdez    PRE-OPERATIVE DIAGNOSIS:  RIGHT WRIST FRACTURE  POST-OPERATIVE DIAGNOSIS:  Same  PROCEDURE:  OPEN REDUCTION INTERNAL FIXATION (ORIF) WRIST FRACTURE  SURGEON:  Sheral Apley, MD  ASSISTANT: Alfonse Alpers, PA-C, he was present and scrubbed throughout the case, critical for completion in a timely fashion, and for retraction, instrumentation, and closure.   ANESTHESIA:   gen  PREOPERATIVE INDICATIONS:  Beth Valdez is a  19 y.o. female with a diagnosis of RIGHT WRIST FRACTURE who failed conservative measures and elected for surgical management.    The risks benefits and alternatives were discussed with the patient preoperatively including but not limited to the risks of infection, bleeding, nerve injury, cardiopulmonary complications, the need for revision surgery, among others, and the patient was willing to proceed.  OPERATIVE IMPLANTS: DVR plate  OPERATIVE FINDINGS: unstable fracture with articular involvment  BLOOD LOSS: min  COMPLICATIONS: none  TOURNIQUET TIME: noe  OPERATIVE PROCEDURE:  Patient was identified in the preoperative holding area and site was marked by me She was transported to the operating theater and placed on the table in supine position taking care to pad all bony prominences. After a preincinduction time out anesthesia was induced. The right upper extremity was prepped and draped in normal sterile fashion and a pre-incision timeout was performed. She received ancef for preoperative antibiotics.   I made a 5 cm incision centered over her FCR tendon and dissected down carefully to the level of the flexor tendon sheath and incise this longitudinally and retracted the FCR radially and incised the dorsal aspect of the sheath.   I bluntly dissected the FPL muscle belly away from the brachioradialis and then sharply incised the pronator tendon from the distal radius and from the wrist capsule. I Elevated this  off the bone the fractures visible.   I released the brachioradialis from its insertion. I then debrided the fracture and performed a manual reduction. There were at least 3 articular pieces of the fracture.  I selected a plate and I placed it on the bone. I pinned it into place and was happy on multiple radiographic views with it's placement. I then fixed the plate distally with the locking pegs. I confirmed no articular penetration with the pegs and that none were prominent dorsally.   I then reduced the plate to the shaft improving the volar and radial tilt of her distal radius.  I was happy with the final fluoro xrays. I reviewed more than 3 views of the wrist including obliques and ap/lat   I thoroughly irrigated the wound and closed the pronator over top of the plate and then closed the skin in layers with absorbable stitch. Sterile dressing was applied using the PACU in stable condition.  POST OPERATIVE PLAN: NWB, Splint full time. Ambulate for DVT px.

## 2019-09-05 NOTE — Interval H&P Note (Signed)
History and Physical Interval Note:  09/05/2019 9:44 AM  Beth Valdez  has presented today for surgery, with the diagnosis of RIGHT WRIST FRACTURE.  The various methods of treatment have been discussed with the patient and family. After consideration of risks, benefits and other options for treatment, the patient has consented to  Procedure(s): OPEN REDUCTION INTERNAL FIXATION (ORIF) WRIST FRACTURE (Right) as a surgical intervention.  The patient's history has been reviewed, patient examined, no change in status, stable for surgery.  I have reviewed the patient's chart and labs.  Questions were answered to the patient's satisfaction.     Sheral Apley

## 2019-09-05 NOTE — Transfer of Care (Signed)
Immediate Anesthesia Transfer of Care Note  Patient: Beth Valdez  Procedure(s) Performed: OPEN REDUCTION INTERNAL FIXATION (ORIF) WRIST FRACTURE (Right Wrist)  Patient Location: PACU  Anesthesia Type:MAC combined with regional for post-op pain  Level of Consciousness: awake, alert  and oriented  Airway & Oxygen Therapy: Patient Spontanous Breathing and Patient connected to nasal cannula oxygen  Post-op Assessment: Report given to RN and Post -op Vital signs reviewed and stable  Post vital signs: Reviewed and stable  Last Vitals:  Vitals Value Taken Time  BP 134/74 09/05/19 1232  Temp 36.5 C 09/05/19 1232  Pulse 84 09/05/19 1235  Resp 20 09/05/19 1235  SpO2 98 % 09/05/19 1235  Vitals shown include unvalidated device data.  Last Pain:  Vitals:   09/05/19 1034  TempSrc: Oral  PainSc: 0-No pain      Patients Stated Pain Goal: 2 (40/35/24 8185)  Complications: No complications documented.

## 2019-09-05 NOTE — Anesthesia Postprocedure Evaluation (Signed)
Anesthesia Post Note  Patient: Beth Valdez  Procedure(s) Performed: OPEN REDUCTION INTERNAL FIXATION (ORIF) WRIST FRACTURE (Right Wrist)     Patient location during evaluation: PACU Anesthesia Type: Regional Level of consciousness: awake and alert and patient cooperative Pain management: pain level controlled Vital Signs Assessment: post-procedure vital signs reviewed and stable Respiratory status: spontaneous breathing and respiratory function stable Cardiovascular status: stable Anesthetic complications: no   No complications documented.  Last Vitals:  Vitals:   09/05/19 1300 09/05/19 1345  BP: (!) 124/59 114/83  Pulse: (!) 58 98  Resp: 18 18  Temp:  36.4 C  SpO2: 99% 100%    Last Pain:  Vitals:   09/05/19 1345  TempSrc:   PainSc: 0-No pain                 Phyllip Claw DAVID

## 2019-09-05 NOTE — Progress Notes (Signed)
Assisted Dr. Michelle Piper with ultrasound guided right sided supraclavicular block. Side rails up, monitors on throughout procedure. See vital signs in flow sheet. Tolerated Procedure well.

## 2019-09-05 NOTE — Anesthesia Procedure Notes (Signed)
Anesthesia Regional Block: Supraclavicular block   Pre-Anesthetic Checklist: ,, timeout performed, Correct Patient, Correct Site, Correct Laterality, Correct Procedure, Correct Position, site marked, Risks and benefits discussed,  Surgical consent,  Pre-op evaluation,  At surgeon's request and post-op pain management  Laterality: Right  Prep: chloraprep       Needles:   Needle Type: Echogenic Stimulator Needle     Needle Length: 9cm  Needle Gauge: 21     Additional Needles:   Procedures:, nerve stimulator,,,,,,,   Nerve Stimulator or Paresthesia:  Response: 0.4 mA,   Additional Responses:   Narrative:  Start time: 09/05/2019 11:08 AM End time: 09/05/2019 11:18 AM Injection made incrementally with aspirations every 5 mL.  Performed by: Personally  Anesthesiologist: Arta Bruce, MD  Additional Notes: Monitors applied. Patient sedated. Sterile prep and drape,hand hygiene and sterile gloves were used. Relevant anatomy identified.Needle position confirmed.Local anesthetic injected incrementally after negative aspiration. Local anesthetic spread visualized around nerve(s). Vascular puncture avoided. No complications. Image printed for medical record.The patient tolerated the procedure well.

## 2019-09-05 NOTE — Discharge Instructions (Signed)

## 2019-09-05 NOTE — Progress Notes (Signed)
Patient sling and splint removed after nerve block complete. Dr. Eulah Pont states pre-op arm scrub can be held d/t O.R. being ready for patient now, arm to be scrubbed in O.R.

## 2019-09-06 ENCOUNTER — Encounter (HOSPITAL_BASED_OUTPATIENT_CLINIC_OR_DEPARTMENT_OTHER): Payer: Self-pay | Admitting: Orthopedic Surgery

## 2019-09-15 DIAGNOSIS — S52502D Unspecified fracture of the lower end of left radius, subsequent encounter for closed fracture with routine healing: Secondary | ICD-10-CM | POA: Diagnosis not present

## 2019-09-17 DIAGNOSIS — Z419 Encounter for procedure for purposes other than remedying health state, unspecified: Secondary | ICD-10-CM | POA: Diagnosis not present

## 2019-10-17 ENCOUNTER — Ambulatory Visit: Payer: Medicaid Other

## 2019-10-18 DIAGNOSIS — Z419 Encounter for procedure for purposes other than remedying health state, unspecified: Secondary | ICD-10-CM | POA: Diagnosis not present

## 2019-10-19 ENCOUNTER — Ambulatory Visit (INDEPENDENT_AMBULATORY_CARE_PROVIDER_SITE_OTHER): Payer: Medicaid Other | Admitting: *Deleted

## 2019-10-19 ENCOUNTER — Encounter: Payer: Self-pay | Admitting: *Deleted

## 2019-10-19 ENCOUNTER — Other Ambulatory Visit (HOSPITAL_COMMUNITY)
Admission: RE | Admit: 2019-10-19 | Discharge: 2019-10-19 | Disposition: A | Discharge status: Home / Self Care | Payer: Medicaid Other | Source: Ambulatory Visit | Attending: Obstetrics and Gynecology | Admitting: Obstetrics and Gynecology

## 2019-10-19 ENCOUNTER — Other Ambulatory Visit: Payer: Self-pay

## 2019-10-19 VITALS — BP 115/53 | HR 71 | Ht 67.0 in | Wt 243.6 lb

## 2019-10-19 DIAGNOSIS — F32A Depression, unspecified: Secondary | ICD-10-CM

## 2019-10-19 DIAGNOSIS — N898 Other specified noninflammatory disorders of vagina: Secondary | ICD-10-CM

## 2019-10-19 NOTE — Progress Notes (Signed)
I have reviewed this chart and agree with the RN/CMA assessment and management.    K. Meryl Onyekachi Gathright, M.D. Attending [facility] (Faculty Practice)   

## 2019-10-19 NOTE — Progress Notes (Signed)
Pt reports white vaginal discharge x2 weeks following sex with a new partner. She did not use a condom. She requests testing for STI's as well as yeast and BV. Self swab obtained. Pt was advised that she will be notified of test results as well as treatment if indicated via MyChart. Pt also stated that her mother thinks she has been depressed since the birth of her child on 09/30/18. Per chart review, pt did not keep multiple appts for PP care and IUD insertion. She currently has appt scheduled on 9/29 for IUD. She was offered referral for [facility] and she accepted. Her appt will be scheduled @ check-out. Pt was also informed of walk-in hours @ [facility] across the street on every Friday in case she would like to be seen sooner. She voiced understanding of all information and instructions given.

## 2019-10-20 LAB — CERVICOVAGINAL ANCILLARY ONLY
Bacterial Vaginitis (gardnerella): POSITIVE — AB
Candida Glabrata: NEGATIVE
Candida Vaginitis: POSITIVE — AB
Chlamydia: POSITIVE — AB
Comment: NEGATIVE
Comment: NEGATIVE
Comment: NEGATIVE
Comment: NEGATIVE
Comment: NEGATIVE
Comment: NORMAL
Neisseria Gonorrhea: NEGATIVE
Trichomonas: NEGATIVE

## 2019-10-20 NOTE — BH Specialist Note (Signed)
Pt did not arrive to video visit and did not answer the phone ; Voicemail not set up so unable to leave voice message; left MyChart message for patient.   

## 2019-10-24 ENCOUNTER — Telehealth: Payer: Self-pay | Admitting: General Practice

## 2019-10-24 ENCOUNTER — Other Ambulatory Visit: Payer: Self-pay | Admitting: Obstetrics and Gynecology

## 2019-10-24 MED ORDER — FLUCONAZOLE 150 MG PO TABS
150.0000 mg | ORAL_TABLET | Freq: Once | ORAL | 0 refills | Status: AC
Start: 2019-10-24 — End: 2019-10-24

## 2019-10-24 MED ORDER — AZITHROMYCIN 500 MG PO TABS: 1000.0000 mg | ORAL_TABLET | Freq: Once | ORAL | 0 refills | Status: AC

## 2019-10-24 MED ORDER — METRONIDAZOLE 500 MG PO TABS: 500.0000 mg | ORAL_TABLET | Freq: Two times a day (BID) | ORAL | 0 refills | Status: DC

## 2019-10-24 NOTE — Telephone Encounter (Signed)
-----   Message from Conan Bowens, MD sent at 10/24/2019  1:19 PM EDT ----- Please let patient know she is positive for chlamydia, BV and yeast. Needs treatment for all three, TOC for CT, script sent to pharmacy

## 2019-10-24 NOTE — Telephone Encounter (Signed)
Called patient & informed her of results, medication directions, partner treatment & to abstain from intercourse for 2 weeks following completion of antibiotics. Patient verbalized understanding to all & had no questions. Communication disease card completed & faxed.

## 2019-10-27 ENCOUNTER — Ambulatory Visit: Payer: Medicaid Other | Admitting: Clinical

## 2019-10-27 ENCOUNTER — Other Ambulatory Visit: Payer: Self-pay

## 2019-10-27 DIAGNOSIS — Z91199 Patient's noncompliance with other medical treatment and regimen due to unspecified reason: Secondary | ICD-10-CM

## 2019-11-15 ENCOUNTER — Ambulatory Visit: Payer: Medicaid Other | Admitting: Obstetrics & Gynecology

## 2019-11-15 ENCOUNTER — Encounter: Payer: Self-pay | Admitting: *Deleted

## 2019-11-15 NOTE — Progress Notes (Signed)
  [facility] appointment for IUD insertion. Per discussion with provider please reschedule. Will send message to registrar to reschedule. Daija Routson,RN

## 2019-11-17 DIAGNOSIS — Z419 Encounter for procedure for purposes other than remedying health state, unspecified: Secondary | ICD-10-CM | POA: Diagnosis not present

## 2019-12-14 ENCOUNTER — Ambulatory Visit: Payer: Medicaid Other | Admitting: Obstetrics & Gynecology

## 2019-12-18 DIAGNOSIS — Z419 Encounter for procedure for purposes other than remedying health state, unspecified: Secondary | ICD-10-CM | POA: Diagnosis not present

## 2019-12-26 ENCOUNTER — Encounter: Payer: Self-pay | Admitting: *Deleted

## 2019-12-30 IMAGING — US US MFM OB FOLLOW UP
1 series · 14 of 28 positions shown · non-contrast
Comparison: none

[Series 1: us mfm ob follow up · 45 acquisitions, 14 frames shown]
[im 2/45]
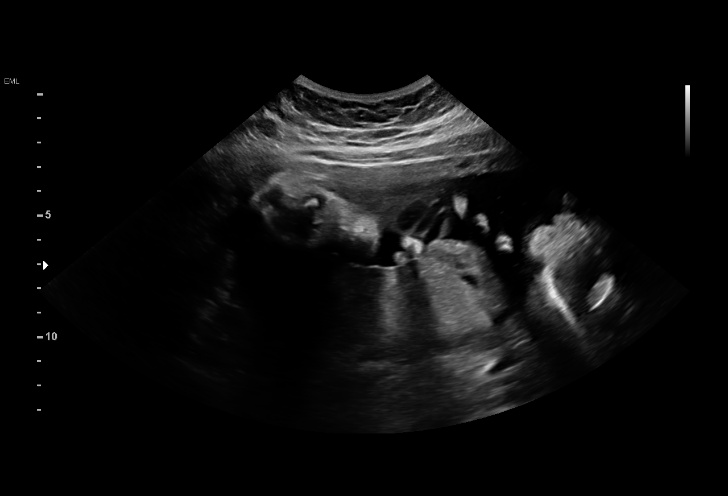
[im 5/45]
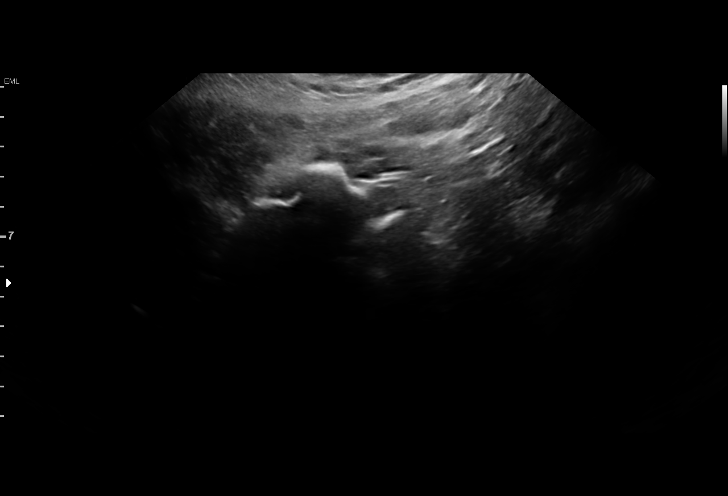
[im 9/45]
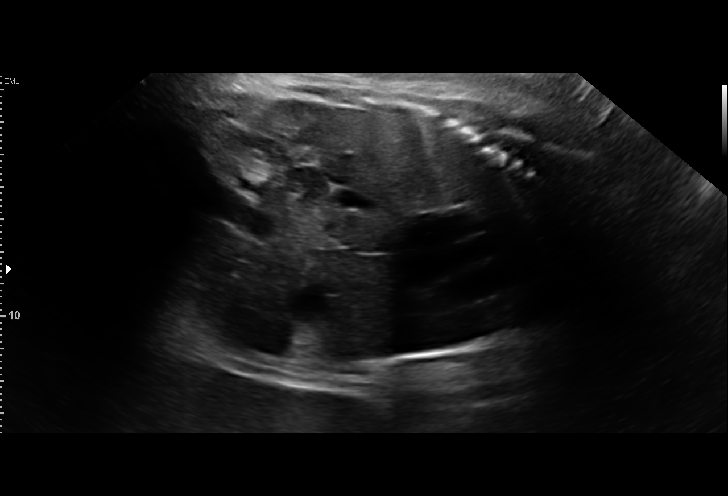
[im 12/45]
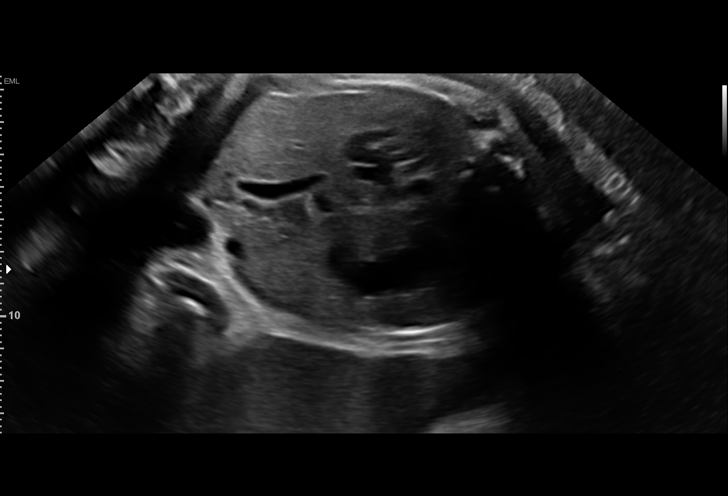
[im 15/45]
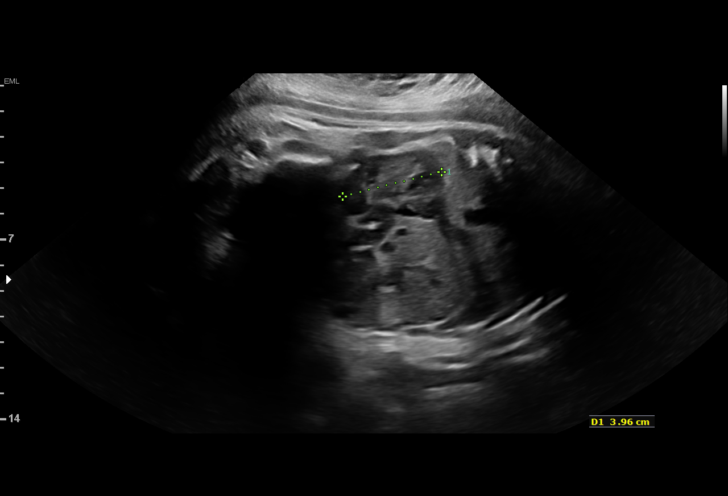
[im 18/45]
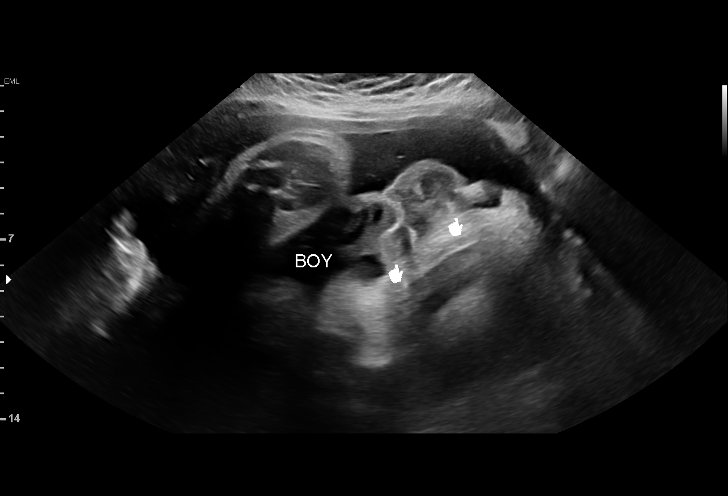
[im 22/45]
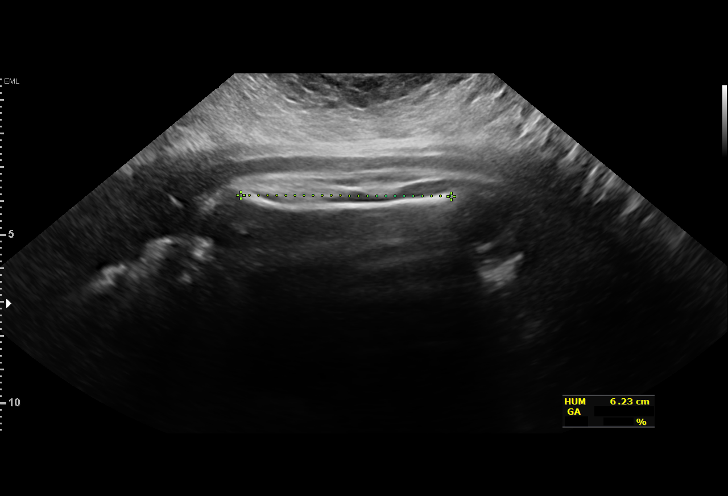
[im 25/45]
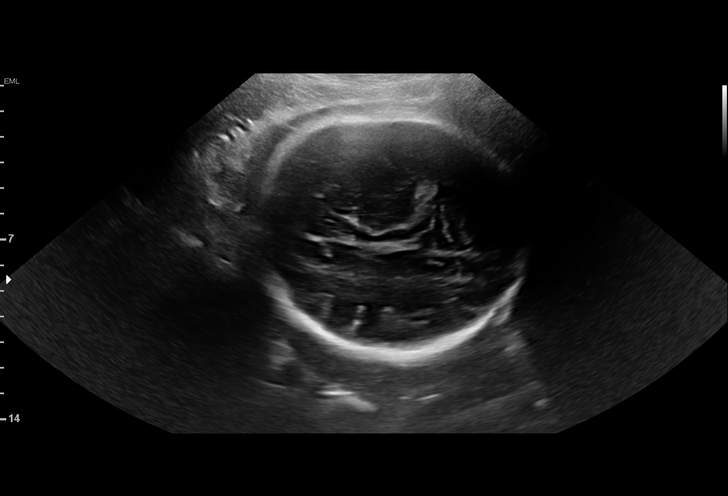
[im 28/45]
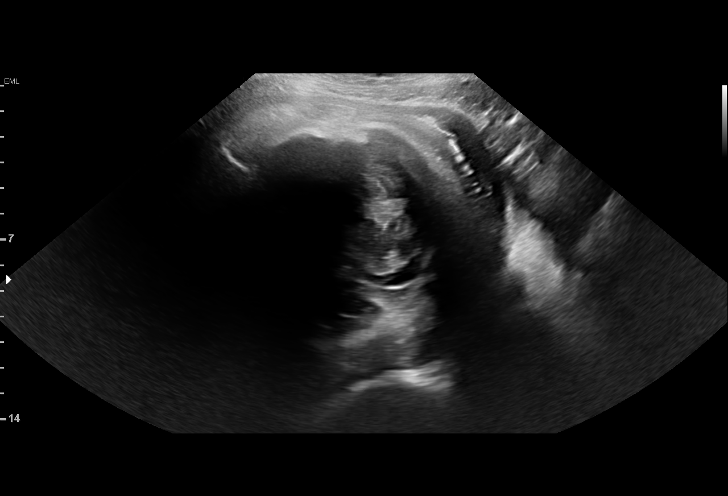
[im 31/45]
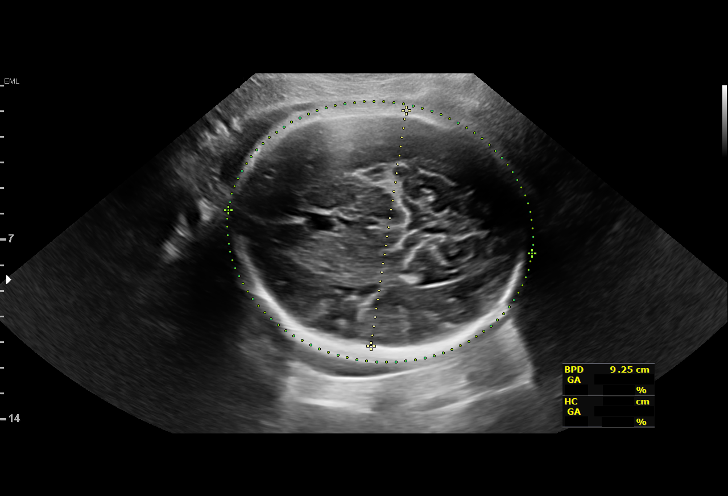
[im 35/45]
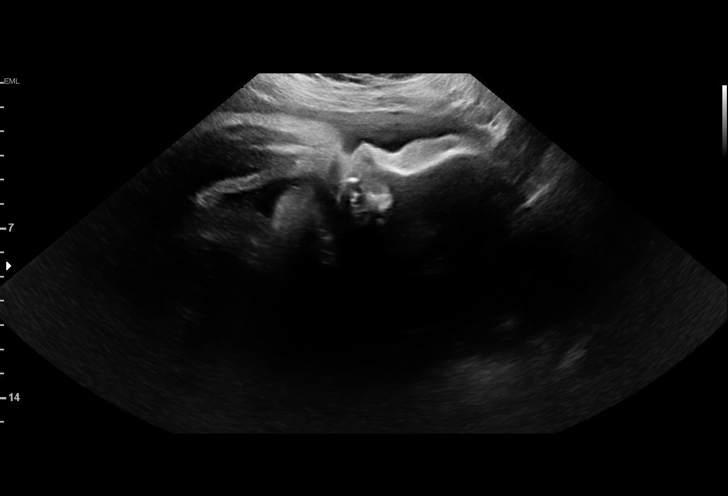
[im 38/45]
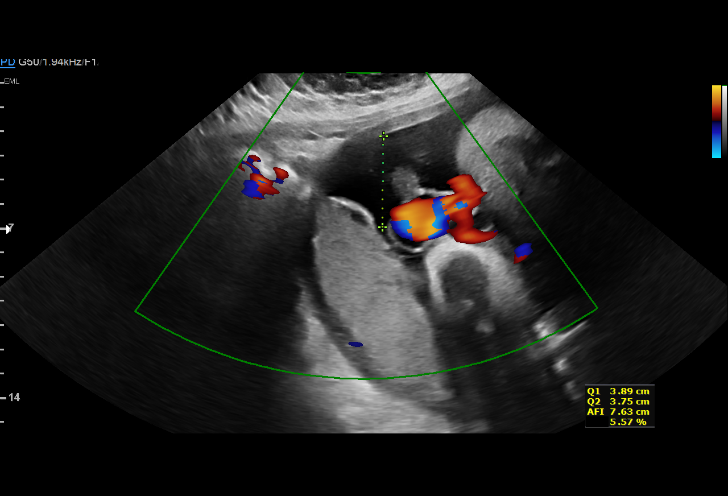
[im 41/45]
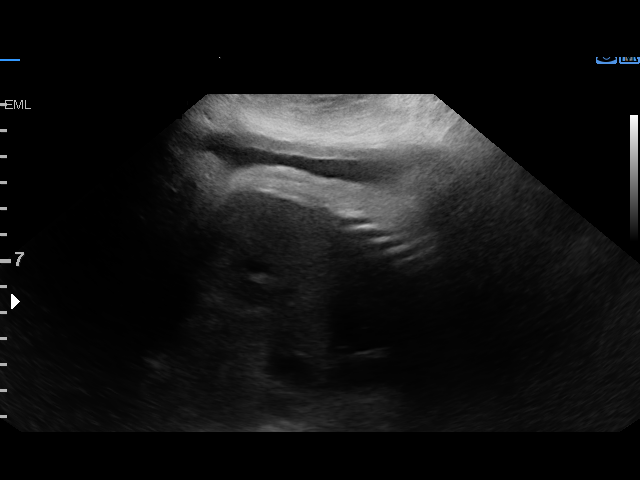
[im 45/45]
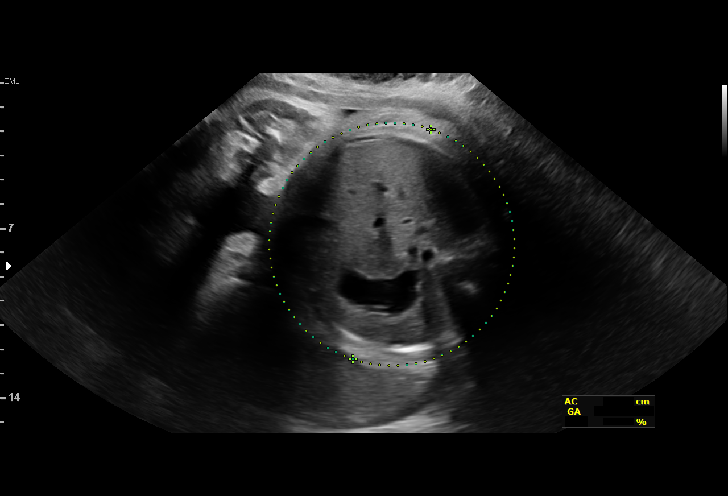

[14 of 28 positions shown; findings below may reference images not displayed]

[address]

 ----------------------------------------------------------------------

 ----------------------------------------------------------------------
Indications

  37 weeks gestation of pregnancy
  Gestational diabetes in pregnancy, diet
  controlled (early dx)(Low Risk NIPS)
  Encounter for other antenatal screening
  follow-up
  Obesity complicating pregnancy, third
  trimester (BMI 39)
 ----------------------------------------------------------------------
Vital Signs

 BMI:
Fetal Evaluation

 Num Of Fetuses:         1
 Fetal Heart Rate(bpm):  151
 Cardiac Activity:       Observed
 Presentation:           Cephalic
 Placenta:               Posterior
 P. Cord Insertion:      Previously Visualized

 Amniotic Fluid
 AFI FV:      Within normal limits

 AFI Sum(cm)     %Tile       Largest Pocket(cm)
 11.39           35

 RUQ(cm)       RLQ(cm)       LUQ(cm)        LLQ(cm)
 3.89          3.75          3.75           0
Biometry
 BPD:      91.9  mm     G. Age:  37w 2d         69  %    CI:         72.1   %    70 - 86
                                                         FL/HC:      21.2   %    20.8 -
 HC:      344.4  mm     G. Age:  39w 6d         83  %    HC/AC:      1.08        0.92 -
 AC:      317.5  mm     G. Age:  35w 5d         20  %    FL/BPD:     79.5   %    71 - 87
 FL:       73.1  mm     G. Age:  37w 3d         53  %    FL/AC:      23.0   %    20 - 24
 HUM:      62.6  mm     G. Age:  36w 2d         54  %
 LV:        4.9  mm

 Est. FW:    2299  gm    6 lb 11 oz      43  %
OB History

 Gravidity:    1         Term:   0        Prem:   0        SAB:   0
 TOP:          0       Ectopic:  0        Living: 0
Gestational Age

 LMP:           37w 2d        Date:  01/11/18                 EDD:   10/18/18
 U/S Today:     37w 4d                                        EDD:   10/16/18
 Best:          37w 2d     Det. By:  LMP  (01/11/18)          EDD:   10/18/18
Anatomy

 Cranium:               Appears normal         LVOT:                   Previously seen
 Cavum:                 Appears normal         Aortic Arch:            Previously seen
 Ventricles:            Appears normal         Ductal Arch:            Previously seen
 Choroid Plexus:        Appears normal         Diaphragm:              Appears normal
 Cerebellum:            Appears normal         Stomach:                Appears normal, left
                                                                       sided
 Posterior Fossa:       Appears normal         Abdomen:                Appears normal
 Nuchal Fold:           Not applicable (>20    Abdominal Wall:         Appears nml (cord
                        wks GA)                                        insert, abd wall)
 Face:                  Appears normal         Cord Vessels:           Appears normal (3
                        (orbits and profile)                           vessel cord)
 Lips:                  Previously seen        Kidneys:                Appear normal
 Palate:                Previously seen        Bladder:                Appears normal
 Thoracic:              Appears normal         Spine:                  Previously seen
 Heart:                 Previously seen        Upper Extremities:      Previously seen
 RVOT:                  Previously seen        Lower Extremities:      Previously seen

 Other:  Heels and 5th digit previously seen.
Cervix Uterus Adnexa

 Cervix
 Not visualized (advanced GA >91wks)

 Left Ovary
 Not visualized.

 Right Ovary
 Not visualized.

 Adnexa
 No abnormality visualized.
Impression

 Normal interval growth.
 [id number]
 BMI
Recommendations

 Follow up as clinically indicated
 Consider delivery by 39 weeks gestation

## 2020-01-16 ENCOUNTER — Inpatient Hospital Stay (HOSPITAL_COMMUNITY)
Admission: AD | Admit: 2020-01-16 | Discharge: 2020-01-16 | Disposition: A | Discharge status: Home / Self Care | Payer: Medicaid Other | Attending: Obstetrics and Gynecology | Admitting: Obstetrics and Gynecology

## 2020-01-16 ENCOUNTER — Other Ambulatory Visit: Payer: Self-pay

## 2020-01-16 ENCOUNTER — Inpatient Hospital Stay (HOSPITAL_COMMUNITY): Payer: Medicaid Other

## 2020-01-16 ENCOUNTER — Encounter (HOSPITAL_COMMUNITY): Payer: Self-pay | Admitting: Obstetrics and Gynecology

## 2020-01-16 ENCOUNTER — Other Ambulatory Visit: Payer: Self-pay | Admitting: Certified Nurse Midwife

## 2020-01-16 DIAGNOSIS — O26891 Other specified pregnancy related conditions, first trimester: Secondary | ICD-10-CM | POA: Diagnosis not present

## 2020-01-16 DIAGNOSIS — O98311 Other infections with a predominantly sexual mode of transmission complicating pregnancy, first trimester: Secondary | ICD-10-CM | POA: Insufficient documentation

## 2020-01-16 DIAGNOSIS — A568 Sexually transmitted chlamydial infection of other sites: Secondary | ICD-10-CM

## 2020-01-16 DIAGNOSIS — R109 Unspecified abdominal pain: Secondary | ICD-10-CM | POA: Diagnosis not present

## 2020-01-16 DIAGNOSIS — Z3A01 Less than 8 weeks gestation of pregnancy: Secondary | ICD-10-CM | POA: Diagnosis not present

## 2020-01-16 DIAGNOSIS — O208 Other hemorrhage in early pregnancy: Secondary | ICD-10-CM | POA: Diagnosis not present

## 2020-01-16 DIAGNOSIS — O23591 Infection of other part of genital tract in pregnancy, first trimester: Secondary | ICD-10-CM

## 2020-01-16 DIAGNOSIS — O219 Vomiting of pregnancy, unspecified: Secondary | ICD-10-CM | POA: Diagnosis not present

## 2020-01-16 DIAGNOSIS — Z3491 Encounter for supervision of normal pregnancy, unspecified, first trimester: Secondary | ICD-10-CM

## 2020-01-16 DIAGNOSIS — A5901 Trichomonal vulvovaginitis: Secondary | ICD-10-CM | POA: Diagnosis not present

## 2020-01-16 LAB — URINALYSIS, ROUTINE W REFLEX MICROSCOPIC
Bilirubin Urine: NEGATIVE
Glucose, UA: NEGATIVE mg/dL
Hgb urine dipstick: NEGATIVE
Ketones, ur: 80 mg/dL — AB
Nitrite: NEGATIVE
Protein, ur: 30 mg/dL — AB
Specific Gravity, Urine: 1.028 (ref 1.005–1.030)
WBC, UA: >50 WBC/hpf — ABNORMAL HIGH (ref 0–5)
pH: 6 (ref 5.0–8.0)

## 2020-01-16 LAB — WET PREP, GENITAL
Clue Cells Wet Prep HPF POC: NONE SEEN
Sperm: NONE SEEN
Yeast Wet Prep HPF POC: NONE SEEN

## 2020-01-16 LAB — HCG, QUANTITATIVE, PREGNANCY: hCG, Beta Chain, Quant, S: 48696 m[IU]/mL — ABNORMAL HIGH (ref <5)

## 2020-01-16 LAB — CBC
HCT: 43.9 % (ref 36.0–46.0)
Hemoglobin: 13.5 g/dL (ref 12.0–15.0)
MCH: 21.9 pg — ABNORMAL LOW (ref 26.0–34.0)
MCHC: 30.8 g/dL (ref 30.0–36.0)
MCV: 71.2 fL — ABNORMAL LOW (ref 80.0–100.0)
Platelets: 360 10*3/uL (ref 150–400)
RBC: 6.17 MIL/uL — ABNORMAL HIGH (ref 3.87–5.11)
RDW: 16.5 % — ABNORMAL HIGH (ref 11.5–15.5)
WBC: 11.6 10*3/uL — ABNORMAL HIGH (ref 4.0–10.5)
nRBC: 0 % (ref 0.0–0.2)

## 2020-01-16 LAB — GC/CHLAMYDIA PROBE AMP (~~LOC~~) NOT AT ARMC
Chlamydia: POSITIVE — AB
Comment: NEGATIVE
Comment: NORMAL
Neisseria Gonorrhea: NEGATIVE

## 2020-01-16 LAB — POCT PREGNANCY, URINE: Preg Test, Ur: POSITIVE — AB

## 2020-01-16 MED ORDER — ONDANSETRON HCL 4 MG/2ML IJ SOLN
4.0000 mg | Freq: Once | INTRAMUSCULAR | Status: AC
  Administered 2020-01-16: 4 mg via INTRAVENOUS
  Filled 2020-01-16: qty 2

## 2020-01-16 MED ORDER — METOCLOPRAMIDE HCL 5 MG/ML IJ SOLN
10.0000 mg | Freq: Once | INTRAMUSCULAR | Status: AC
  Administered 2020-01-16: 10 mg via INTRAVENOUS
  Filled 2020-01-16: qty 2

## 2020-01-16 MED ORDER — METRONIDAZOLE 500 MG PO TABS
2000.0000 mg | ORAL_TABLET | Freq: Once | ORAL | Status: DC
  Filled 2020-01-16: qty 4

## 2020-01-16 MED ORDER — ONDANSETRON 4 MG PO TBDP: 4.0000 mg | ORAL_TABLET | Freq: Three times a day (TID) | ORAL | 0 refills | Status: DC | PRN

## 2020-01-16 MED ORDER — M.V.I. ADULT IV INJ
Freq: Once | INTRAVENOUS | Status: AC
  Filled 2020-01-16: qty 10

## 2020-01-16 MED ORDER — AZITHROMYCIN 500 MG PO TABS: 1000.0000 mg | ORAL_TABLET | Freq: Once | ORAL | 0 refills | Status: AC

## 2020-01-16 MED ORDER — METRONIDAZOLE 500 MG PO TABS: 2000.0000 mg | ORAL_TABLET | Freq: Once | ORAL | 0 refills | Status: AC

## 2020-01-16 MED ORDER — LACTATED RINGERS IV BOLUS
1000.0000 mL | Freq: Once | INTRAVENOUS | Status: AC
  Administered 2020-01-16: 1000 mL via INTRAVENOUS

## 2020-01-16 NOTE — MAU Provider Note (Signed)
History     CSN: [id number]  Arrival date and time: 01/16/20 3149   First Provider Initiated Contact with Patient 01/16/20 0145      Chief Complaint  Patient presents with  . Abdominal Pain  . Nausea   Beth Valdez is a 19 y.o. G3P1 at [redacted]w[redacted]d who presents to MAU with complaints of abdominal pain and emesis. Patient reports that lower abdominal pain has been occurring for about a week but increased last night. Patient describes the pain as lower abdominal cramping. Rates pain 7/10- has not taken any medication for pain. Patient reports that she also is unable to keep anything down. Has been having significant nausea and emesis. Patient reports emesis x7 times over the course of 24 hours. She has an abortion scheduled for tomorrow, as pregnancy is from a one night stand. Patient also report a thin white to clear discharge without odor.    OB History    Gravida  3   Para  1   Term  1   Preterm      AB  1   Living  1     SAB      TAB      Ectopic      Multiple  0   Live Births  1           Past Medical History:  Diagnosis Date  . History of gestational diabetes 09/30/2018  . Medical history non-contributory   . Right wrist fracture 08/29/2019   after fall    Past Surgical History:  Procedure Laterality Date  . NO PAST SURGERIES    . ORIF WRIST FRACTURE Right 09/05/2019   Procedure: OPEN REDUCTION INTERNAL FIXATION (ORIF) WRIST FRACTURE;  Surgeon: Sheral Apley, MD;  Location: [facility] [place];  Service: Orthopedics;  Laterality: Right;    Family History  Problem Relation Age of Onset  . Hypertension Other   . Hypertension Maternal Grandfather     Social History   Tobacco Use  . Smoking status: Never Smoker  . Smokeless tobacco: Never Used  Vaping Use  . Vaping Use: Some days  Substance Use Topics  . Alcohol use: Not Currently  . Drug use: Not Currently    Types: Marijuana    Comment: marijuana last used 09-01-2019     Allergies: No Known Allergies  Medications Prior to Admission  Medication Sig Dispense Refill Last Dose  . metroNIDAZOLE (FLAGYL) 500 MG tablet Take 1 tablet (500 mg total) by mouth 2 (two) times daily. 14 tablet 0     Review of Systems  Constitutional: Negative.   Respiratory: Negative.   Cardiovascular: Negative.   Gastrointestinal: Positive for abdominal pain, nausea and vomiting. Negative for constipation and diarrhea.  Genitourinary: Positive for vaginal discharge. Negative for difficulty urinating, dysuria, frequency, pelvic pain, urgency and vaginal bleeding.  Musculoskeletal: Negative.   Neurological: Negative.   Psychiatric/Behavioral: Negative.    Physical Exam   Blood pressure 121/65, pulse 68, temperature 98.4 F (36.9 C), temperature source Oral, resp. rate 15, last menstrual period 11/27/2019, SpO2 100 %, unknown if currently breastfeeding.  Physical Exam Vitals and nursing note reviewed.  HENT:     Head: Normocephalic.  Cardiovascular:     Rate and Rhythm: Normal rate and regular rhythm.  Pulmonary:     Effort: Pulmonary effort is normal. No respiratory distress.     Breath sounds: Normal breath sounds. No wheezing.  Abdominal:     General: There is no distension.  Palpations: Abdomen is soft. There is no mass.     Tenderness: There is no abdominal tenderness. There is no guarding.  Genitourinary:    Comments: Blind swabs collected Skin:    General: Skin is warm and dry.  Neurological:     Mental Status: She is alert and oriented to person, place, and time.  Psychiatric:        Mood and Affect: Mood normal.        Behavior: Behavior normal.        Thought Content: Thought content normal.    MAU Course  Procedures  MDM  Orders Placed This Encounter  Procedures  . Wet prep, genital  . US OB Comp Less 14 Wks  . Urinalysis, Routine w reflex microscopic Urine, Clean Catch  . CBC  . hCG, quantitative, pregnancy  . Pregnancy, urine POC  .  Insert peripheral IV   Meds ordered this encounter  Medications  . lactated ringers bolus 1,000 mL  . ondansetron (ZOFRAN) injection 4 mg  . multivitamins adult (INFUVITE ADULT) 10 mL in lactated ringers 1,000 mL infusion  . metroNIDAZOLE (FLAGYL) tablet 2,000 mg  . metoCLOPramide (REGLAN) injection 10 mg   Lab and [place] report reviewed:  Results for orders placed or performed during the hospital encounter of 01/16/20 (from the past 24 hour(s))  Pregnancy, urine POC     Status: Abnormal   Collection Time: 01/16/20  1:09 AM  Result Value Ref Range   Preg Test, Ur POSITIVE (A) NEGATIVE  Urinalysis, Routine w reflex microscopic Urine, Clean Catch     Status: Abnormal   Collection Time: 01/16/20  1:20 AM  Result Value Ref Range   Color, Urine AMBER (A) YELLOW   APPearance CLOUDY (A) CLEAR   Specific Gravity, Urine 1.028 1.005 - 1.030   pH 6.0 5.0 - 8.0   Glucose, UA NEGATIVE NEGATIVE mg/dL   Hgb urine dipstick NEGATIVE NEGATIVE   Bilirubin Urine NEGATIVE NEGATIVE   Ketones, ur 80 (A) NEGATIVE mg/dL   Protein, ur 30 (A) NEGATIVE mg/dL   Nitrite NEGATIVE NEGATIVE   Leukocytes,Ua LARGE (A) NEGATIVE   RBC / HPF 21-50 0 - 5 RBC/hpf   WBC, UA >50 (H) 0 - 5 WBC/hpf   Bacteria, UA FEW (A) NONE SEEN   Squamous Epithelial / LPF 21-50 0 - 5   Mucus PRESENT   CBC     Status: Abnormal   Collection Time: 01/16/20  1:53 AM  Result Value Ref Range   WBC 11.6 (H) 4.0 - 10.5 K/uL   RBC 6.17 (H) 3.87 - 5.11 MIL/uL   Hemoglobin 13.5 12.0 - 15.0 g/dL   HCT 96.7 36 - 46 %   MCV 71.2 (L) 80.0 - 100.0 fL   MCH 21.9 (L) 26.0 - 34.0 pg   MCHC 30.8 30.0 - 36.0 g/dL   RDW 89.3 (H) 81.0 - 17.5 %   Platelets 360 150 - 400 K/uL   nRBC 0.0 0.0 - 0.2 %  hCG, quantitative, pregnancy     Status: Abnormal   Collection Time: 01/16/20  1:53 AM  Result Value Ref Range   hCG, Beta Chain, Quant, S 48,696 (H) <5 mIU/mL  Wet prep, genital     Status: Abnormal   Collection Time: 01/16/20  1:53 AM   Specimen:  Vaginal  Result Value Ref Range   Yeast Wet Prep HPF POC NONE SEEN NONE SEEN   Trich, Wet Prep PRESENT (A) NONE SEEN   Clue Cells Wet Prep  HPF POC NONE SEEN NONE SEEN   WBC, Wet Prep HPF POC MANY (A) NONE SEEN   Sperm NONE SEEN    US OB Comp Less 14 Wks  Result Date: 01/16/2020 CLINICAL DATA:  Pregnant, pelvic cramping, vomiting.  LMP 11/27/2019 EXAM: OBSTETRIC <14 WK ULTRASOUND TECHNIQUE: Transabdominal ultrasound was performed for evaluation of the gestation as well as the maternal uterus and adnexal regions. COMPARISON:  None. FINDINGS: Intrauterine gestational sac: Present, single Yolk sac:  Present, single, normal-appearing Embryo:  Present, single Cardiac Activity: Present, regular Heart Rate: 120 bpm MSD: Appropriate given fetal size CRL:   6 mm   6 w 3 d                  [place] EDC: 09/07/2020 Subchorionic hemorrhage:  A small subchorionic hemorrhage is present Maternal uterus/adnexae: The uterus is anteverted. No intrauterine masses are seen. The cervix is not well visualized on this examination. No free fluid is seen within the cul-de-sac. The maternal ovaries are not visualized. IMPRESSION: Single living intrauterine gestation with an estimated gestational age of [redacted] weeks, 3 days. Small subchorionic hemorrhage. Electronically Signed   By: Helyn Numbers MD   On: 01/16/2020 02:36   Trich seen on wet prep, discussed results with patient and need for treatment. Flagyl ordered and reglan given prior to administration of Flagyl- patient reports having a anxiety attack after reglan and request Rx for Flagyl to be taken at home. Rx sent to pharmacy of choice. Rx for zofran sent to pharmacy for home use.   Discussed with patient partner treatment - patient reports that she is not in contact with person that she had IC with but will try to let him know. Discussed reasons to return to MAU. Follow up as scheduled for TAB tomorrow. Return to MAU as needed. Pt stable at time of discharge.   Assessment and  Plan   1. Normal intrauterine pregnancy on prenatal ultrasound in first trimester   2. Abdominal cramping   3. Trichomonal vaginitis during pregnancy in first trimester   4. [redacted] weeks gestation of pregnancy   5. Nausea and vomiting during pregnancy    Discharge home Return to MAU as needed for reasons discussed and/or emergencies  Rx for Flagyl and Zofran   Allergies as of 01/16/2020   No Known Allergies     Medication List    TAKE these medications   metroNIDAZOLE 500 MG tablet Commonly known as: FLAGYL Take 4 tablets (2,000 mg total) by mouth once for 1 dose. What changed:   how much to take  when to take this   ondansetron 4 MG disintegrating tablet Commonly known as: Zofran ODT Take 1 tablet (4 mg total) by mouth every 8 (eight) hours as needed for nausea or vomiting.       Sharyon Cable CNM 01/16/2020, 3:06 AM

## 2020-01-16 NOTE — MAU Note (Signed)
. °  Beth Valdez is a 19 y.o. at [redacted]w[redacted]d here in MAU reporting: lower abdominal cramping that increased last night. She has also thrown up x7 in the past 24 hours and states that the vomiting makes the pain worse. +HPT  Pain score: 7 Vitals:   01/16/20 0133  BP: 121/65  Pulse: 68  Resp: 15  Temp: 98.4 F (36.9 C)  SpO2: 100%      Lab orders placed from triage: UA

## 2020-01-17 DIAGNOSIS — Z419 Encounter for procedure for purposes other than remedying health state, unspecified: Secondary | ICD-10-CM | POA: Diagnosis not present

## 2020-01-29 ENCOUNTER — Other Ambulatory Visit: Payer: Self-pay | Admitting: Certified Nurse Midwife

## 2020-02-02 ENCOUNTER — Ambulatory Visit: Payer: Self-pay | Admitting: Certified Nurse Midwife

## 2020-02-17 DIAGNOSIS — Z419 Encounter for procedure for purposes other than remedying health state, unspecified: Secondary | ICD-10-CM | POA: Diagnosis not present

## 2020-03-19 DIAGNOSIS — Z419 Encounter for procedure for purposes other than remedying health state, unspecified: Secondary | ICD-10-CM | POA: Diagnosis not present

## 2020-03-30 ENCOUNTER — Encounter (HOSPITAL_BASED_OUTPATIENT_CLINIC_OR_DEPARTMENT_OTHER): Payer: Self-pay

## 2020-03-30 ENCOUNTER — Emergency Department (HOSPITAL_BASED_OUTPATIENT_CLINIC_OR_DEPARTMENT_OTHER)
Admission: EM | Admit: 2020-03-30 | Discharge: 2020-03-30 | Disposition: A | Discharge status: Home / Self Care | Payer: Medicaid Other | Attending: Emergency Medicine | Admitting: Emergency Medicine

## 2020-03-30 ENCOUNTER — Other Ambulatory Visit: Payer: Self-pay

## 2020-03-30 DIAGNOSIS — K0889 Other specified disorders of teeth and supporting structures: Secondary | ICD-10-CM | POA: Diagnosis not present

## 2020-03-30 MED ORDER — HYDROCODONE-ACETAMINOPHEN 5-325 MG PO TABS
1.0000 | ORAL_TABLET | Freq: Once | ORAL | Status: AC
  Administered 2020-03-30: 1 via ORAL
  Filled 2020-03-30: qty 1

## 2020-03-30 NOTE — ED Triage Notes (Signed)
Pt presents with complaints of R lower tooth pain x 1 day. States she is scheduled for a root canal and recently completed abx and motrin with no relief.

## 2020-03-30 NOTE — Discharge Instructions (Signed)
Please see attached dental resource guide for other dentists in this area. You can also follow up with Dr. Mia Creek who is the dentist on call today.   Continue taking your antibiotics as prescribed until you are finished. You can take 800 mg Ibuprofen every 8 hours and 1,000 mg Tylenol every 8 hours as needed for pain (recommend to alternate Ibuprofen and Tylenol every 4 hours).   Follow up with your PCP regarding your ED visit. Return to the ED for any worsening symptoms.

## 2020-03-30 NOTE — ED Provider Notes (Signed)
[facility] EMERGENCY DEPARTMENT Provider Note   CSN: [id number] Arrival date & time: 03/30/20  1726     History Chief Complaint  Patient presents with  . Dental Pain    Beth Valdez is a 20 y.o. female who presents to the ED today with complaint of gradual onset, constant, sharp, severe, right lower dental pain that began 2 weeks ago, worsening yesterday. Pt reports she went to see a dentist in [place], [place] who evaluated her and recommended a root canal - she reports she was prescribed antibiotics (amoxicillin) and 800 mg Ibuprofen without relief. Pt states she was never scheduled for a root canal as they wanted her to come back in to be evaluated however she never did as she felt they were too far away in [place]. Pt states the pain worsened last night prompting her to come in to the ED for evaluation. She denies fevers, chills, drainage from tooth, trismus, sore throat, inability to swallow, or any other associated symptoms.   The history is provided by the patient and medical records.       Past Medical History:  Diagnosis Date  . History of gestational diabetes 09/30/2018  . Medical history non-contributory   . Right wrist fracture 08/29/2019   after fall    Patient Active Problem List   Diagnosis Date Noted  . Indication for care in labor or delivery 09/30/2018  . Gestational diabetes mellitus (GDM) affecting pregnancy 04/20/2018  . Prediabetes 04/01/2018  . Supervision of normal first teen pregnancy 03/22/2018  . Chlamydia trachomatis infection during pregnancy in first trimester, antepartum 03/02/2018    Past Surgical History:  Procedure Laterality Date  . NO PAST SURGERIES    . ORIF WRIST FRACTURE Right 09/05/2019   Procedure: OPEN REDUCTION INTERNAL FIXATION (ORIF) WRIST FRACTURE;  Surgeon: Sheral Apley, MD;  Location: [facility];  Service: Orthopedics;  Laterality: Right;     OB History    Gravida  3   Para  1   Term   1   Preterm      AB  1   Living  1     SAB      IAB      Ectopic      Multiple  0   Live Births  1           Family History  Problem Relation Age of Onset  . Hypertension Other   . Hypertension Maternal Grandfather     Social History   Tobacco Use  . Smoking status: Never Smoker  . Smokeless tobacco: Never Used  Vaping Use  . Vaping Use: Some days  Substance Use Topics  . Alcohol use: Not Currently  . Drug use: Not Currently    Types: Marijuana    Comment: marijuana last used 09-01-2019    Home Medications Prior to Admission medications   Medication Sig Start Date End Date Taking? Authorizing Provider  ondansetron (ZOFRAN ODT) 4 MG disintegrating tablet Take 1 tablet (4 mg total) by mouth every 8 (eight) hours as needed for nausea or vomiting. 01/16/20   Sharyon Cable, CNM    Allergies    Patient has no known allergies.  Review of Systems   Review of Systems  Constitutional: Negative for chills and fever.  HENT: Positive for dental problem. Negative for ear pain, facial swelling, sore throat and trouble swallowing.     Physical Exam Updated Vital Signs BP (!) 151/85   Pulse 79  Temp 98 F (36.7 C) (Oral)   Resp 20   Ht 5\' 8"  (1.727 m)   Wt 104.3 kg   LMP  (LMP Unknown)   SpO2 100%   Breastfeeding Unknown   BMI 34.97 kg/m   Physical Exam Vitals and nursing note reviewed.  Constitutional:      Appearance: She is not ill-appearing.  HENT:     Head: Normocephalic and atraumatic.     Mouth/Throat:     Mouth: Mucous membranes are moist.     Comments: Nose clear.  R lower tooth #31 decayed with caries with TTP, with minimal surrounding gingival swelling and erythema, no definite abscess, no evidence of ludwig's.  Oropharynx clear and moist, without uvular swelling or deviation, no trismus or drooling, no tonsillar swelling or erythema, no exudates.    Eyes:     Conjunctiva/sclera: Conjunctivae normal.  Cardiovascular:     Rate  and Rhythm: Normal rate and regular rhythm.  Pulmonary:     Effort: Pulmonary effort is normal.     Breath sounds: Normal breath sounds.  Skin:    General: Skin is warm and dry.     Coloration: Skin is not jaundiced.  Neurological:     Mental Status: She is alert.     ED Results / Procedures / Treatments   Labs (all labs ordered are listed, but only abnormal results are displayed) Labs Reviewed - No data to display  EKG None  Radiology No results found.  Procedures Procedures   Medications Ordered in ED Medications  HYDROcodone-acetaminophen (NORCO/VICODIN) 5-325 MG per tablet 1 tablet (1 tablet Oral Given 03/30/20 1944)    ED Course  I have reviewed the triage vital signs and the nursing notes.  Pertinent labs & imaging results that were available during my care of the patient were reviewed by me and considered in my medical decision making (see chart for details).    MDM Rules/Calculators/A&P                          20 year old female presenting to the ED today with complaints of worsening right lower dental pain x 2 weeks, currently on amoxicillin. Has not followed back up with her dentist. On arrival to the ED VSS. Pt is afebrile, nontachycardic, and nontachypneic. She does have decayed tooth #31 with associated TTP however no signs of definitive dental abscess to be drained. No concern for Ludwig's angina at this time. Pt will need to follow back up with her dentist - she is requesting resource guide for dentists closer to this area; will provide. Will give 1 dose of pain medication in the ED today however discussed with pt that narcotics will not be prescribed for dental pain. She is instructed to take Ibuprofen and Tylenol PRN for pain. Pt is in agreement with plan and stable for discharge home.   This note was prepared using Dragon voice recognition software and may include unintentional dictation errors due to the inherent limitations of voice recognition  software.  Final Clinical Impression(s) / ED Diagnoses Final diagnoses:  Pain, dental    Rx / DC Orders ED Discharge Orders    None       Discharge Instructions     Please see attached dental resource guide for other dentists in this area. You can also follow up with Dr. 26 who is the dentist on call today.   Continue taking your antibiotics as prescribed until you are finished.  You can take 800 mg Ibuprofen every 8 hours and 1,000 mg Tylenol every 8 hours as needed for pain (recommend to alternate Ibuprofen and Tylenol every 4 hours).   Follow up with your PCP regarding your ED visit. Return to the ED for any worsening symptoms.        Tanda Rockers, PA-C 03/30/20 1954    Virgina Norfolk, DO 03/30/20 2241

## 2020-04-16 DIAGNOSIS — Z419 Encounter for procedure for purposes other than remedying health state, unspecified: Secondary | ICD-10-CM | POA: Diagnosis not present

## 2020-05-17 DIAGNOSIS — Z419 Encounter for procedure for purposes other than remedying health state, unspecified: Secondary | ICD-10-CM | POA: Diagnosis not present

## 2020-06-16 DIAGNOSIS — Z419 Encounter for procedure for purposes other than remedying health state, unspecified: Secondary | ICD-10-CM | POA: Diagnosis not present

## 2020-07-17 DIAGNOSIS — Z419 Encounter for procedure for purposes other than remedying health state, unspecified: Secondary | ICD-10-CM | POA: Diagnosis not present

## 2020-08-16 DIAGNOSIS — Z419 Encounter for procedure for purposes other than remedying health state, unspecified: Secondary | ICD-10-CM | POA: Diagnosis not present

## 2020-09-11 DIAGNOSIS — S52502D Unspecified fracture of the lower end of left radius, subsequent encounter for closed fracture with routine healing: Secondary | ICD-10-CM | POA: Diagnosis not present

## 2020-09-16 DIAGNOSIS — Z419 Encounter for procedure for purposes other than remedying health state, unspecified: Secondary | ICD-10-CM | POA: Diagnosis not present

## 2020-10-17 DIAGNOSIS — Z419 Encounter for procedure for purposes other than remedying health state, unspecified: Secondary | ICD-10-CM | POA: Diagnosis not present

## 2020-11-16 DIAGNOSIS — Z419 Encounter for procedure for purposes other than remedying health state, unspecified: Secondary | ICD-10-CM | POA: Diagnosis not present

## 2020-12-17 DIAGNOSIS — Z419 Encounter for procedure for purposes other than remedying health state, unspecified: Secondary | ICD-10-CM | POA: Diagnosis not present

## 2020-12-29 DIAGNOSIS — K047 Periapical abscess without sinus: Secondary | ICD-10-CM | POA: Diagnosis not present

## 2021-01-16 DIAGNOSIS — Z419 Encounter for procedure for purposes other than remedying health state, unspecified: Secondary | ICD-10-CM | POA: Diagnosis not present

## 2021-02-16 DIAGNOSIS — Y249XXA Unspecified firearm discharge, undetermined intent, initial encounter: Secondary | ICD-10-CM

## 2021-02-16 DIAGNOSIS — Z419 Encounter for procedure for purposes other than remedying health state, unspecified: Secondary | ICD-10-CM | POA: Diagnosis not present

## 2021-02-16 HISTORY — DX: Unspecified firearm discharge, undetermined intent, initial encounter: Y24.9XXA

## 2021-02-24 ENCOUNTER — Ambulatory Visit: Payer: Medicaid Other

## 2021-03-19 DIAGNOSIS — Z419 Encounter for procedure for purposes other than remedying health state, unspecified: Secondary | ICD-10-CM | POA: Diagnosis not present

## 2021-04-16 DIAGNOSIS — Z419 Encounter for procedure for purposes other than remedying health state, unspecified: Secondary | ICD-10-CM | POA: Diagnosis not present

## 2021-04-17 IMAGING — US US OB COMP LESS 14 WK
1 series · 15 of 28 positions shown · non-contrast
Comparison: None.

CLINICAL DATA: Pregnant, pelvic cramping, vomiting.  LMP 11/27/2019

EXAM:
OBSTETRIC <14 WK ULTRASOUND
TECHNIQUE: Transabdominal ultrasound was performed for evaluation of the
gestation as well as the maternal uterus and adnexal regions.

[Series 1: us ob comp less 14 wk · 15 of 29 slices shown]
[im 1/29]
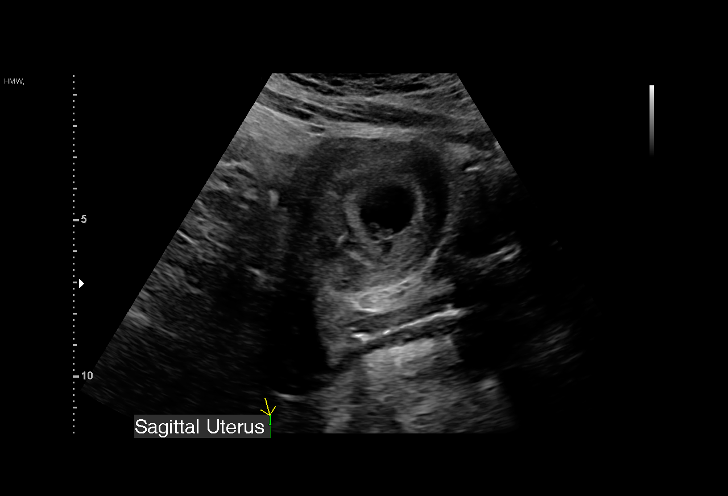
[im 3/29]
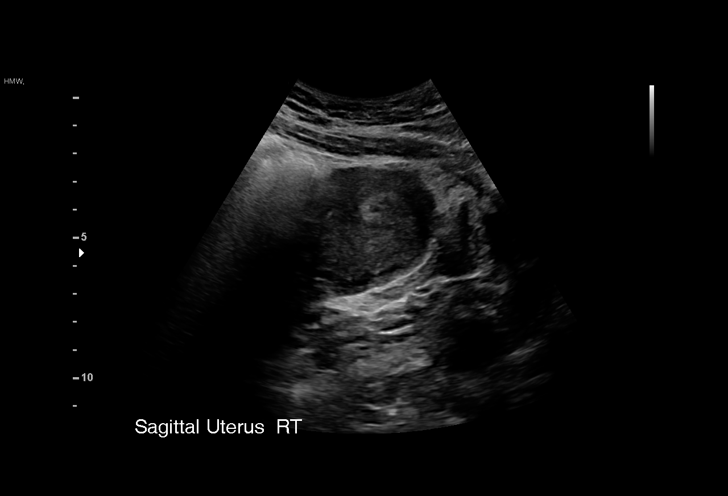
[im 5/29]
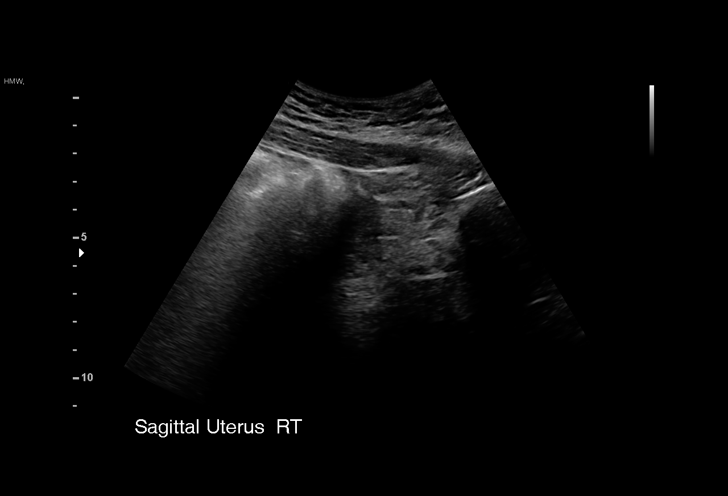
[im 7/29]
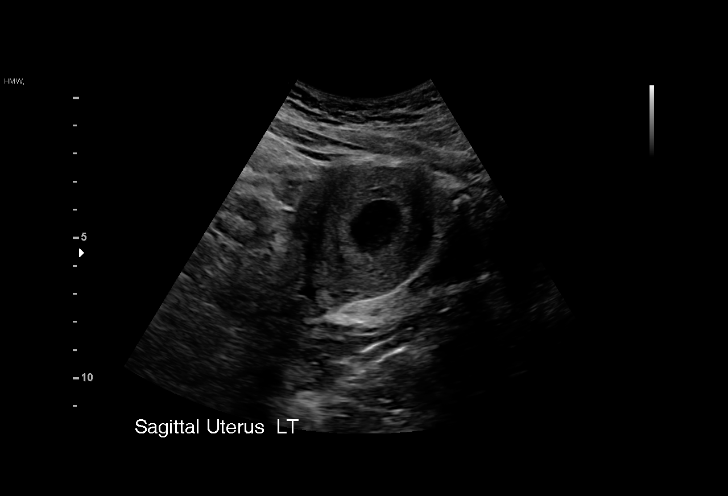
[im 9/29]
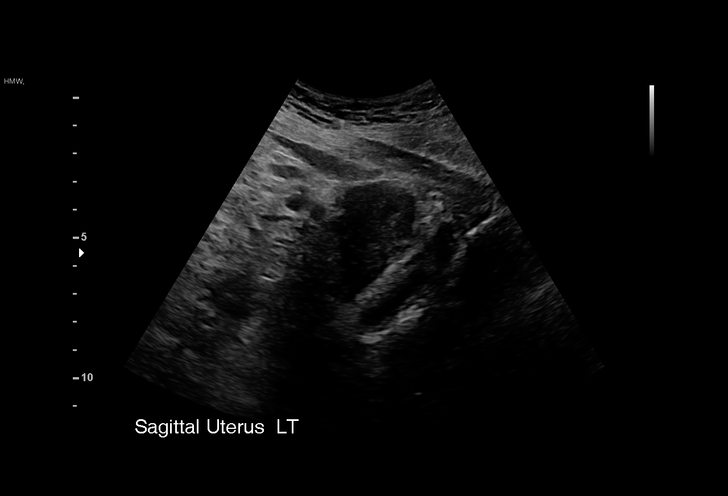
[im 11/29]
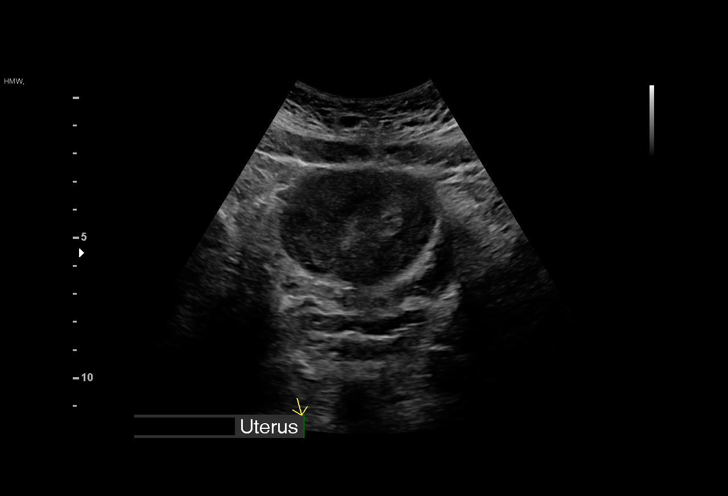
[im 13/29]
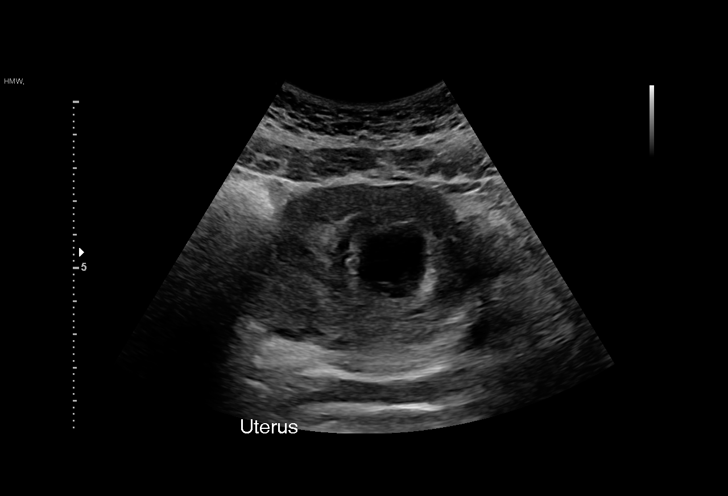
[im 15/29]
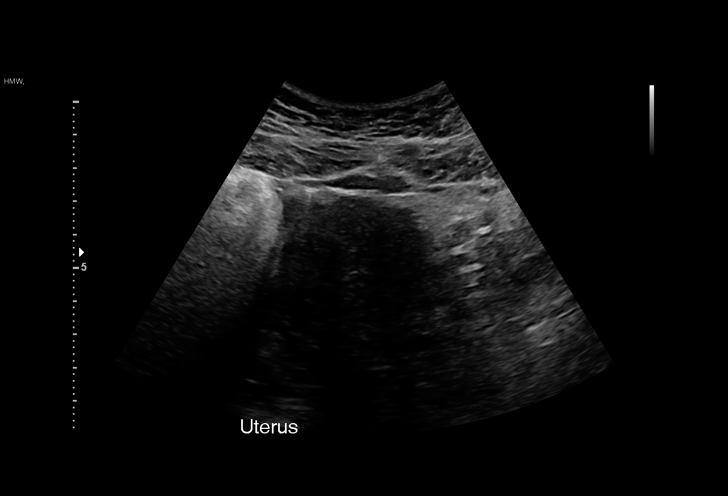
[im 16/29]
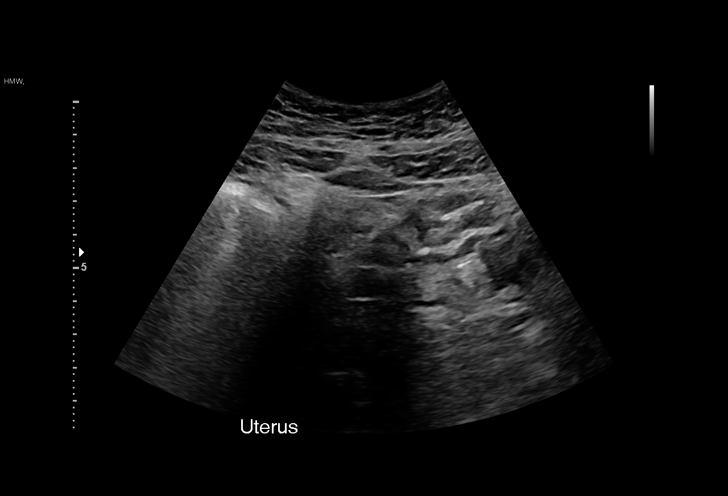
[im 18/29]
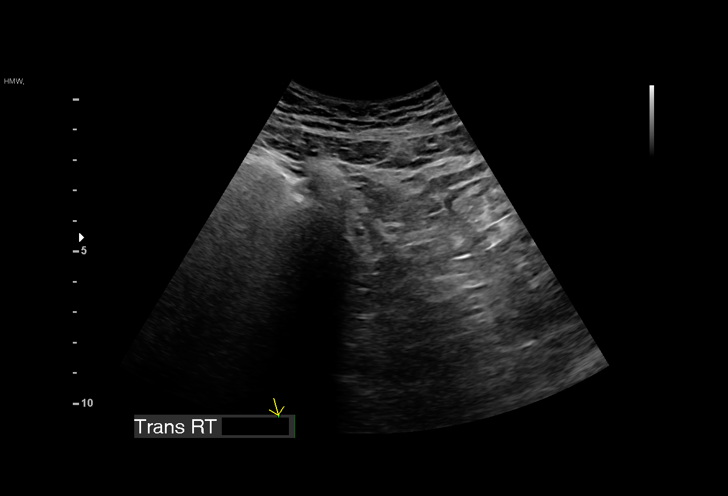
[im 20/29]
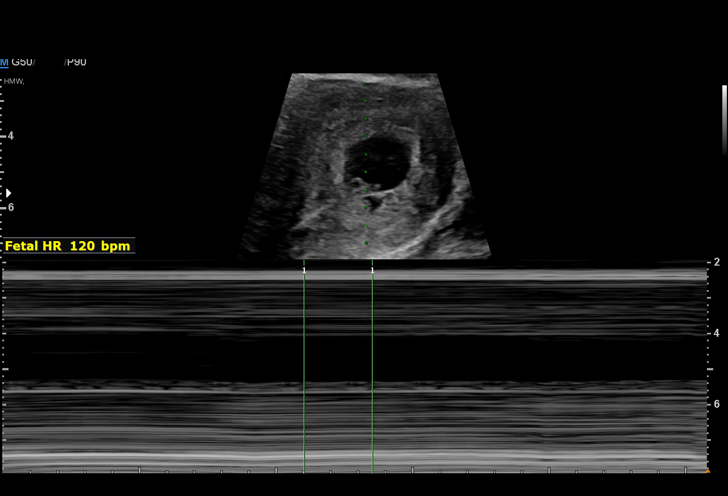
[im 22/29]
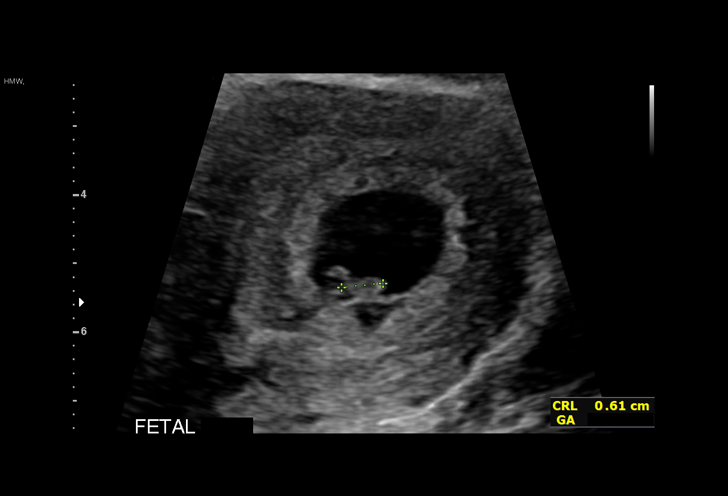
[im 24/29]
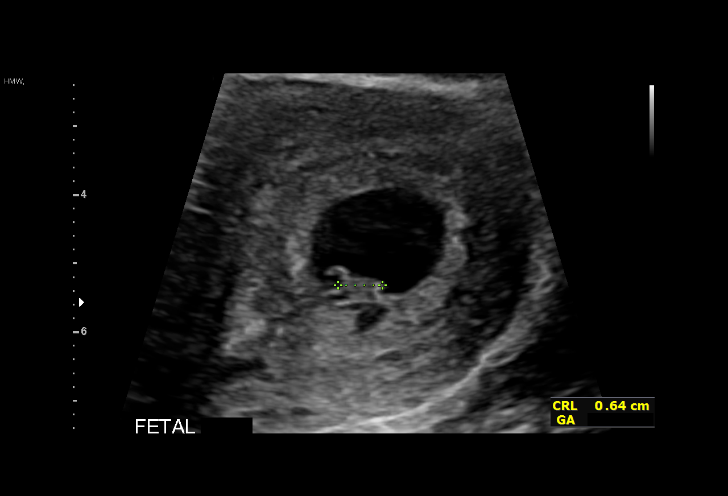
[im 26/29]
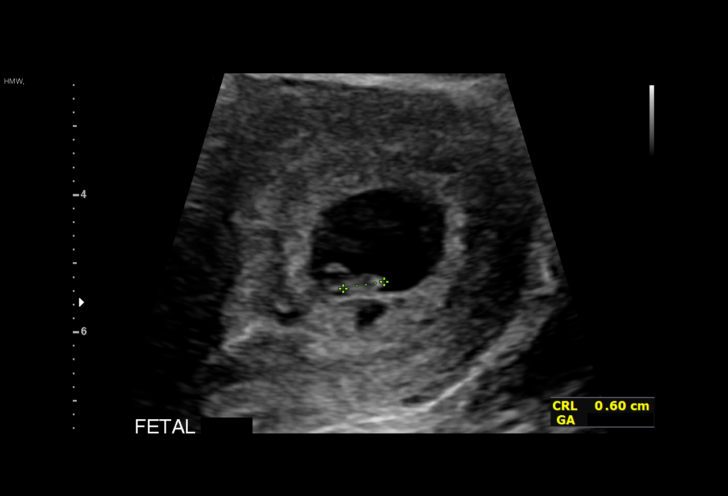
[im 29/29]
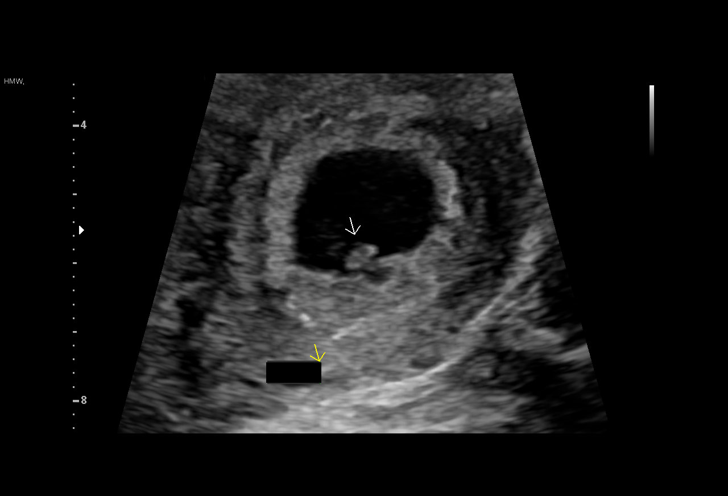

[15 of 28 positions shown; findings below may reference images not displayed]

FINDINGS: Intrauterine gestational sac: Present, single

Yolk sac:  Present, single, normal-appearing

Embryo:  Present, single

Cardiac Activity: Present, regular

Heart Rate: 120 bpm

MSD: Appropriate given fetal size

CRL:   6 mm   6 w 3 d                  US EDC: 09/07/2020

Subchorionic hemorrhage:  A small subchorionic hemorrhage is present

Maternal uterus/adnexae: The uterus is anteverted. No intrauterine
masses are seen. The cervix is not well visualized on this
examination. No free fluid is seen within the cul-de-sac. The
maternal ovaries are not visualized.
IMPRESSION: Single living intrauterine gestation with an estimated gestational
age of 6 weeks, 3 days.

Small subchorionic hemorrhage.

## 2021-05-17 DIAGNOSIS — Z419 Encounter for procedure for purposes other than remedying health state, unspecified: Secondary | ICD-10-CM | POA: Diagnosis not present

## 2021-06-16 DIAGNOSIS — Z419 Encounter for procedure for purposes other than remedying health state, unspecified: Secondary | ICD-10-CM | POA: Diagnosis not present

## 2021-07-17 DIAGNOSIS — Z419 Encounter for procedure for purposes other than remedying health state, unspecified: Secondary | ICD-10-CM | POA: Diagnosis not present

## 2021-08-16 DIAGNOSIS — Z419 Encounter for procedure for purposes other than remedying health state, unspecified: Secondary | ICD-10-CM | POA: Diagnosis not present

## 2021-09-03 ENCOUNTER — Other Ambulatory Visit (HOSPITAL_COMMUNITY)
Admission: RE | Admit: 2021-09-03 | Discharge: 2021-09-03 | Disposition: A | Discharge status: Home / Self Care | Payer: Medicaid Other | Source: Ambulatory Visit | Attending: Family Medicine | Admitting: Family Medicine

## 2021-09-03 ENCOUNTER — Other Ambulatory Visit: Payer: Self-pay

## 2021-09-03 ENCOUNTER — Ambulatory Visit (INDEPENDENT_AMBULATORY_CARE_PROVIDER_SITE_OTHER): Payer: Medicaid Other

## 2021-09-03 VITALS — BP 120/70 | HR 53 | Wt 192.3 lb

## 2021-09-03 DIAGNOSIS — Z113 Encounter for screening for infections with a predominantly sexual mode of transmission: Secondary | ICD-10-CM | POA: Insufficient documentation

## 2021-09-03 NOTE — Progress Notes (Signed)
Patient here today requesting STD testing. I instructed patient on how to collect a self swab. Self swab collected without issue. Patient also requests blood STD testing. Blood work completed. I explained to patient we will notify her with any abnormal results. Patient verbalized understanding.  Alesia Richards, RN 09/03/21

## 2021-09-04 ENCOUNTER — Other Ambulatory Visit: Payer: Self-pay | Admitting: Family Medicine

## 2021-09-04 LAB — CERVICOVAGINAL ANCILLARY ONLY
Chlamydia: POSITIVE — AB
Comment: NEGATIVE
Comment: NEGATIVE
Comment: NORMAL
Neisseria Gonorrhea: NEGATIVE
Trichomonas: NEGATIVE

## 2021-09-04 LAB — HIV ANTIBODY (ROUTINE TESTING W REFLEX): HIV Screen 4th Generation wRfx: NONREACTIVE

## 2021-09-04 LAB — RPR: RPR Ser Ql: NONREACTIVE

## 2021-09-04 LAB — HEPATITIS B SURFACE ANTIGEN: Hepatitis B Surface Ag: NEGATIVE

## 2021-09-04 LAB — HEPATITIS C ANTIBODY: Hep C Virus Ab: NONREACTIVE

## 2021-09-04 MED ORDER — FLUCONAZOLE 150 MG PO TABS: 150.0000 mg | ORAL_TABLET | Freq: Once | ORAL | 0 refills | Status: AC

## 2021-09-10 ENCOUNTER — Encounter: Payer: Self-pay | Admitting: *Deleted

## 2021-09-10 ENCOUNTER — Telehealth: Payer: Self-pay | Admitting: *Deleted

## 2021-09-10 MED ORDER — DOXYCYCLINE HYCLATE 100 MG PO TABS: 100.0000 mg | ORAL_TABLET | Freq: Two times a day (BID) | ORAL | 0 refills | Status: DC

## 2021-09-10 NOTE — Telephone Encounter (Signed)
Called pt regarding recent wet prep result showing +Chlamydia.  She did not answer and her voicemail was full. Mychart message sent to pt, Rx sent to pharmacy and STI report faxed to [facility].

## 2021-09-20 DIAGNOSIS — T1490XA Injury, unspecified, initial encounter: Secondary | ICD-10-CM | POA: Diagnosis not present

## 2021-09-20 DIAGNOSIS — R52 Pain, unspecified: Secondary | ICD-10-CM | POA: Diagnosis not present

## 2021-09-20 DIAGNOSIS — R000 Tachycardia, unspecified: Secondary | ICD-10-CM | POA: Diagnosis not present

## 2021-09-20 DIAGNOSIS — W3400XA Accidental discharge from unspecified firearms or gun, initial encounter: Secondary | ICD-10-CM | POA: Diagnosis not present

## 2021-09-20 DIAGNOSIS — Z23 Encounter for immunization: Secondary | ICD-10-CM | POA: Diagnosis not present

## 2021-09-20 DIAGNOSIS — S81832A Puncture wound without foreign body, left lower leg, initial encounter: Secondary | ICD-10-CM | POA: Diagnosis not present

## 2021-09-20 DIAGNOSIS — R58 Hemorrhage, not elsewhere classified: Secondary | ICD-10-CM | POA: Diagnosis not present

## 2021-09-20 DIAGNOSIS — S81841A Puncture wound with foreign body, right lower leg, initial encounter: Secondary | ICD-10-CM | POA: Diagnosis not present

## 2021-09-20 DIAGNOSIS — S81831A Puncture wound without foreign body, right lower leg, initial encounter: Secondary | ICD-10-CM | POA: Diagnosis not present

## 2021-09-20 DIAGNOSIS — S2193XA Puncture wound without foreign body of unspecified part of thorax, initial encounter: Secondary | ICD-10-CM | POA: Diagnosis not present

## 2021-09-20 DIAGNOSIS — Z743 Need for continuous supervision: Secondary | ICD-10-CM | POA: Diagnosis not present

## 2021-09-20 DIAGNOSIS — Z1810 Retained metal fragments, unspecified: Secondary | ICD-10-CM | POA: Diagnosis not present

## 2021-09-22 ENCOUNTER — Other Ambulatory Visit: Payer: Self-pay

## 2021-09-22 ENCOUNTER — Encounter (HOSPITAL_BASED_OUTPATIENT_CLINIC_OR_DEPARTMENT_OTHER): Payer: Self-pay | Admitting: Emergency Medicine

## 2021-09-22 ENCOUNTER — Emergency Department (HOSPITAL_BASED_OUTPATIENT_CLINIC_OR_DEPARTMENT_OTHER)
Admission: EM | Admit: 2021-09-22 | Discharge: 2021-09-22 | Disposition: A | Discharge status: Home / Self Care | Payer: Medicaid Other | Attending: Emergency Medicine | Admitting: Emergency Medicine

## 2021-09-22 DIAGNOSIS — Z4800 Encounter for change or removal of nonsurgical wound dressing: Secondary | ICD-10-CM | POA: Insufficient documentation

## 2021-09-22 DIAGNOSIS — Z5189 Encounter for other specified aftercare: Secondary | ICD-10-CM

## 2021-09-22 DIAGNOSIS — Z4801 Encounter for change or removal of surgical wound dressing: Secondary | ICD-10-CM | POA: Diagnosis not present

## 2021-09-22 MED ORDER — OXYCODONE HCL 5 MG PO TABS: 5.0000 mg | ORAL_TABLET | Freq: Four times a day (QID) | ORAL | 0 refills | Status: DC | PRN

## 2021-09-22 MED ORDER — DOXYCYCLINE HYCLATE 100 MG PO CAPS: 100.0000 mg | ORAL_CAPSULE | Freq: Two times a day (BID) | ORAL | 0 refills | Status: DC

## 2021-09-22 MED ORDER — OXYCODONE HCL 5 MG PO TABS
5.0000 mg | ORAL_TABLET | Freq: Once | ORAL | Status: AC
  Administered 2021-09-22: 5 mg via ORAL
  Filled 2021-09-22: qty 1

## 2021-09-22 NOTE — ED Notes (Signed)
AVS provided and reviewed with client, pt teaching done regards to importance of taking and completing all abx as prescribed, also discussed safety while taking PO Opioids. Pt teaching also done re: wound care and monitoring for signs and symptoms of infection. Dsg supplies provided to client as well.

## 2021-09-22 NOTE — Discharge Instructions (Addendum)
Recommend bacitracin or Neosporin over wounds twice daily.  Clean with water and soap daily.    You have been prescribed narcotic pain medicine for breakthrough pain.  Take antibiotic as prescribed.  Follow-up with Dr. Conner with general surgery. 
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
## 2021-09-23 ENCOUNTER — Ambulatory Visit: Payer: Medicaid Other | Admitting: Family Medicine

## 2021-10-13 ENCOUNTER — Ambulatory Visit: Payer: Medicaid Other

## 2021-10-30 ENCOUNTER — Ambulatory Visit: Payer: Medicaid Other | Admitting: Advanced Practice Midwife

## 2021-11-24 ENCOUNTER — Ambulatory Visit: Payer: Medicaid Other | Admitting: Family Medicine

## 2022-01-20 ENCOUNTER — Encounter (HOSPITAL_COMMUNITY): Payer: Self-pay

## 2022-01-20 ENCOUNTER — Ambulatory Visit (HOSPITAL_COMMUNITY)
Admission: EM | Admit: 2022-01-20 | Discharge: 2022-01-20 | Disposition: A | Discharge status: Home / Self Care | Payer: Medicaid Other | Attending: Family Medicine | Admitting: Family Medicine

## 2022-01-20 DIAGNOSIS — Z202 Contact with and (suspected) exposure to infections with a predominantly sexual mode of transmission: Secondary | ICD-10-CM | POA: Insufficient documentation

## 2022-01-20 LAB — POC URINE PREG, ED: Preg Test, Ur: NEGATIVE

## 2022-01-20 MED ORDER — DOXYCYCLINE HYCLATE 100 MG PO CAPS: 100.0000 mg | ORAL_CAPSULE | Freq: Two times a day (BID) | ORAL | 0 refills | Status: AC

## 2022-01-20 NOTE — Discharge Instructions (Signed)
Pregnancy test was negative  Take doxycycline 100 mg --1 capsule 2 times daily for 7 days  Will notify you of any positives on the swab
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
## 2022-01-20 NOTE — ED Notes (Signed)
Called for triage twice with no response  
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
## 2022-01-23 ENCOUNTER — Telehealth (HOSPITAL_COMMUNITY): Payer: Self-pay | Admitting: Emergency Medicine

## 2022-01-23 LAB — CERVICOVAGINAL ANCILLARY ONLY
Bacterial Vaginitis (gardnerella): POSITIVE — AB
Candida Glabrata: NEGATIVE
Candida Vaginitis: NEGATIVE
Chlamydia: NEGATIVE
Comment: NEGATIVE
Comment: NEGATIVE
Comment: NEGATIVE
Comment: NEGATIVE
Comment: NEGATIVE
Comment: NORMAL
Neisseria Gonorrhea: NEGATIVE
Trichomonas: NEGATIVE

## 2022-01-23 MED ORDER — METRONIDAZOLE 500 MG PO TABS: 500.0000 mg | ORAL_TABLET | Freq: Two times a day (BID) | ORAL | 0 refills | Status: DC

## 2022-07-18 ENCOUNTER — Emergency Department (HOSPITAL_COMMUNITY): Payer: 59

## 2022-07-18 ENCOUNTER — Other Ambulatory Visit: Payer: Self-pay

## 2022-07-18 ENCOUNTER — Encounter (HOSPITAL_COMMUNITY): Payer: Self-pay

## 2022-07-18 ENCOUNTER — Emergency Department (HOSPITAL_COMMUNITY)
Admission: EM | Admit: 2022-07-18 | Discharge: 2022-07-18 | Disposition: A | Discharge status: Home / Self Care | Payer: 59 | Attending: Emergency Medicine | Admitting: Emergency Medicine

## 2022-07-18 DIAGNOSIS — R791 Abnormal coagulation profile: Secondary | ICD-10-CM | POA: Insufficient documentation

## 2022-07-18 DIAGNOSIS — S8991XA Unspecified injury of right lower leg, initial encounter: Secondary | ICD-10-CM | POA: Diagnosis not present

## 2022-07-18 DIAGNOSIS — S81801A Unspecified open wound, right lower leg, initial encounter: Secondary | ICD-10-CM | POA: Insufficient documentation

## 2022-07-18 DIAGNOSIS — S81831A Puncture wound without foreign body, right lower leg, initial encounter: Secondary | ICD-10-CM

## 2022-07-18 DIAGNOSIS — R000 Tachycardia, unspecified: Secondary | ICD-10-CM | POA: Diagnosis not present

## 2022-07-18 DIAGNOSIS — W3400XA Accidental discharge from unspecified firearms or gun, initial encounter: Secondary | ICD-10-CM | POA: Diagnosis not present

## 2022-07-18 LAB — CBC WITH DIFFERENTIAL/PLATELET
Abs Immature Granulocytes: 0.03 10*3/uL (ref 0.00–0.07)
Basophils Absolute: 0 10*3/uL (ref 0.0–0.1)
Basophils Relative: 0 %
Eosinophils Absolute: 0.1 10*3/uL (ref 0.0–0.5)
Eosinophils Relative: 1 %
HCT: 42.5 % (ref 36.0–46.0)
Hemoglobin: 13.1 g/dL (ref 12.0–15.0)
Immature Granulocytes: 0 %
Lymphocytes Relative: 27 %
Lymphs Abs: 2.9 10*3/uL (ref 0.7–4.0)
MCH: 22.5 pg — ABNORMAL LOW (ref 26.0–34.0)
MCHC: 30.8 g/dL (ref 30.0–36.0)
MCV: 72.9 fL — ABNORMAL LOW (ref 80.0–100.0)
Monocytes Absolute: 0.7 10*3/uL (ref 0.1–1.0)
Monocytes Relative: 6 %
Neutro Abs: 6.9 10*3/uL (ref 1.7–7.7)
Neutrophils Relative %: 66 %
Platelets: 243 10*3/uL (ref 150–400)
RBC: 5.83 MIL/uL — ABNORMAL HIGH (ref 3.87–5.11)
RDW: 14.9 % (ref 11.5–15.5)
WBC: 10.6 10*3/uL — ABNORMAL HIGH (ref 4.0–10.5)
nRBC: 0 % (ref 0.0–0.2)

## 2022-07-18 LAB — I-STAT CHEM 8, ED
BUN: 7 mg/dL (ref 6–20)
Calcium, Ion: 1.15 mmol/L (ref 1.15–1.40)
Chloride: 103 mmol/L (ref 98–111)
Creatinine, Ser: 0.7 mg/dL (ref 0.44–1.00)
Glucose, Bld: 102 mg/dL — ABNORMAL HIGH (ref 70–99)
HCT: 43 % (ref 36.0–46.0)
Hemoglobin: 14.6 g/dL (ref 12.0–15.0)
Potassium: 3.3 mmol/L — ABNORMAL LOW (ref 3.5–5.1)
Sodium: 141 mmol/L (ref 135–145)
TCO2: 25 mmol/L (ref 22–32)

## 2022-07-18 LAB — BASIC METABOLIC PANEL
Anion gap: 11 (ref 5–15)
BUN: 8 mg/dL (ref 6–20)
CO2: 22 mmol/L (ref 22–32)
Calcium: 8.7 mg/dL — ABNORMAL LOW (ref 8.9–10.3)
Chloride: 105 mmol/L (ref 98–111)
Creatinine, Ser: 0.8 mg/dL (ref 0.44–1.00)
GFR, Estimated: >60 mL/min (ref >60)
Glucose, Bld: 108 mg/dL — ABNORMAL HIGH (ref 70–99)
Potassium: 3.4 mmol/L — ABNORMAL LOW (ref 3.5–5.1)
Sodium: 138 mmol/L (ref 135–145)

## 2022-07-18 LAB — PROTIME-INR
INR: 1 (ref 0.8–1.2)
Prothrombin Time: 12.9 seconds (ref 11.4–15.2)

## 2022-07-18 LAB — I-STAT BETA HCG BLOOD, ED (MC, WL, AP ONLY): I-stat hCG, quantitative: <5 m[IU]/mL (ref <5)

## 2022-07-18 LAB — SAMPLE TO BLOOD BANK

## 2022-07-18 MED ORDER — IOHEXOL 350 MG/ML SOLN
100.0000 mL | Freq: Once | INTRAVENOUS | Status: AC | PRN
  Administered 2022-07-18: 100 mL via INTRAVENOUS

## 2022-07-18 MED ORDER — OXYCODONE-ACETAMINOPHEN 5-325 MG PO TABS
2.0000 | ORAL_TABLET | Freq: Once | ORAL | Status: AC
  Administered 2022-07-18: 2 via ORAL
  Filled 2022-07-18: qty 2

## 2022-07-18 MED ORDER — CEPHALEXIN 500 MG PO CAPS: 500.0000 mg | ORAL_CAPSULE | Freq: Four times a day (QID) | ORAL | 0 refills | Status: DC

## 2022-07-18 MED ORDER — KETOROLAC TROMETHAMINE 15 MG/ML IJ SOLN
15.0000 mg | Freq: Once | INTRAMUSCULAR | Status: AC
  Administered 2022-07-18: 15 mg via INTRAVENOUS
  Filled 2022-07-18: qty 1

## 2022-07-18 MED ORDER — OXYCODONE-ACETAMINOPHEN 5-325 MG PO TABS: 1.0000 | ORAL_TABLET | Freq: Four times a day (QID) | ORAL | 0 refills | Status: DC | PRN

## 2022-07-18 MED ORDER — CEFAZOLIN SODIUM-DEXTROSE 2-4 GM/100ML-% IV SOLN
2.0000 g | Freq: Once | INTRAVENOUS | Status: AC
  Administered 2022-07-18: 2 g via INTRAVENOUS
  Filled 2022-07-18: qty 100
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
## 2022-07-18 NOTE — ED Notes (Signed)
Tourniquet removed by PA at bedside. Bleeding controlled. Gauze and Coban applied

## 2022-07-18 NOTE — Discharge Instructions (Signed)
Take Keflex as to prevent infection.  Take Percocet as needed for pain.  Do not drive or drink alcohol after taking this medication as it may make you drowsy and impair your judgment.  Avoid soaking your wound in stagnant or dirty water such as while taking a bath. You can shower normally. Keep the area clean with mild soap and warm water. Do not apply peroxide or alcohol to your wound as this can break down newly forming skin and prolong wound healing. If you keep the area bandaged, change the dressing/bandage at least once per day.  Have your wound rechecked by a primary care doctor in 1 week.

## 2022-07-29 ENCOUNTER — Ambulatory Visit
Admission: EM | Admit: 2022-07-29 | Discharge: 2022-07-29 | Disposition: A | Discharge status: Home / Self Care | Payer: 59 | Attending: Emergency Medicine | Admitting: Emergency Medicine

## 2022-07-29 DIAGNOSIS — S81831A Puncture wound without foreign body, right lower leg, initial encounter: Secondary | ICD-10-CM | POA: Diagnosis not present

## 2022-07-29 MED ORDER — PREDNISONE 20 MG PO TABS: 40.0000 mg | ORAL_TABLET | Freq: Every day | ORAL | 0 refills | Status: DC

## 2022-07-29 MED ORDER — CHLORHEXIDINE GLUCONATE 4 % EX SOLN: Freq: Every day | CUTANEOUS | 0 refills | Status: DC | PRN

## 2022-07-29 MED ORDER — MELOXICAM 7.5 MG PO TABS: 7.5000 mg | ORAL_TABLET | Freq: Every day | ORAL | 0 refills | Status: DC

## 2022-07-29 MED ORDER — TRAMADOL HCL 50 MG PO TABS: 50.0000 mg | ORAL_TABLET | Freq: Two times a day (BID) | ORAL | 0 refills | Status: DC | PRN
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
## 2022-08-02 LAB — AEROBIC CULTURE W GRAM STAIN (SUPERFICIAL SPECIMEN)

## 2022-08-13 ENCOUNTER — Ambulatory Visit
Admission: EM | Admit: 2022-08-13 | Discharge: 2022-08-13 | Disposition: A | Discharge status: Home / Self Care | Payer: 59 | Attending: Family Medicine | Admitting: Family Medicine

## 2022-08-13 DIAGNOSIS — T148XXA Other injury of unspecified body region, initial encounter: Secondary | ICD-10-CM | POA: Diagnosis not present

## 2022-08-13 DIAGNOSIS — L089 Local infection of the skin and subcutaneous tissue, unspecified: Secondary | ICD-10-CM

## 2022-08-13 MED ORDER — MUPIROCIN 2 % EX OINT: 1.0000 | TOPICAL_OINTMENT | Freq: Two times a day (BID) | CUTANEOUS | 0 refills | Status: DC

## 2022-08-13 MED ORDER — AMOXICILLIN-POT CLAVULANATE 875-125 MG PO TABS: 1.0000 | ORAL_TABLET | Freq: Two times a day (BID) | ORAL | 0 refills | Status: AC
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
## 2022-08-13 NOTE — Discharge Instructions (Signed)
Take amoxicillin-clavulanate 875 mg--1 tab twice daily with food for 7 days  Put mupirocin ointment on the sore areas twice daily until improved   
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
## 2022-08-19 ENCOUNTER — Encounter (HOSPITAL_BASED_OUTPATIENT_CLINIC_OR_DEPARTMENT_OTHER): Payer: 59 | Attending: General Surgery | Admitting: General Surgery

## 2022-08-19 DIAGNOSIS — L97812 Non-pressure chronic ulcer of other part of right lower leg with fat layer exposed: Secondary | ICD-10-CM | POA: Diagnosis not present

## 2022-08-19 DIAGNOSIS — S81841A Puncture wound with foreign body, right lower leg, initial encounter: Secondary | ICD-10-CM | POA: Diagnosis not present

## 2022-08-19 DIAGNOSIS — S81801A Unspecified open wound, right lower leg, initial encounter: Secondary | ICD-10-CM | POA: Diagnosis not present

## 2022-08-19 DIAGNOSIS — W3400XA Accidental discharge from unspecified firearms or gun, initial encounter: Secondary | ICD-10-CM | POA: Insufficient documentation
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
## 2022-08-19 NOTE — Progress Notes (Signed)
[place], [place] ([id number]) [id number] Page 1 of 4 Visit Report for 08/19/2022 Abuse Risk Screen Details Patient Name: Date of Service: Beth Valdez NA 08/19/2022 12:45 PM Medical Record Number: [id number] Patient Account Number: [id number] Date of Birth/Sex: Treating RN: 2000/04/15 (22 y.o. Beth Valdez Primary Care Beth Valdez: Beth Valdez Other Clinician: Referring Beth Valdez: Treating Beth Valdez/Extender: Beth Valdez Weeks in Treatment: 0 Abuse Risk Screen Items Answer ABUSE RISK SCREEN: Has anyone close to you tried to hurt or harm you recentlyo No Do you feel uncomfortable with anyone in your familyo No Has anyone forced you do things that you didnt want to doo No Electronic Signature(s) Signed: 08/19/2022 4:16:02 PM By: Samuella Bruin Entered By: Samuella Bruin on 08/19/2022 13:05:17 -------------------------------------------------------------------------------- Activities of Daily Living Details Patient Name: Date of Service: Beth Valdez NA 08/19/2022 12:45 PM Medical Record Number: [id number] Patient Account Number: [id number] Date of Birth/Sex: Treating RN: Aug 21, 2000 (22 y.o. Beth Valdez Primary Care Tom Valdez: Beth Valdez Other Clinician: Referring Timothey Dahlstrom: Treating Beth Valdez/Extender: Beth Valdez Weeks in Treatment: 0 Activities of Daily Living Items Answer Activities of Daily Living (Please select one for each item) Drive Automobile Completely Able T Medications ake Completely Able Use T elephone Completely Able Care for Appearance Completely Able Use T oilet Completely Able Bath / Shower Completely Able Dress Self Completely Able Feed Self Completely Able Walk Completely Able Get In / Out Bed Completely Able Housework Completely Able Prepare Meals Completely Able Handle Money Completely Able Shop for Self Completely Able Electronic Signature(s) Signed: 08/19/2022 4:16:02  PM By: Samuella Bruin Entered By: Samuella Bruin on 08/19/2022 13:05:35 Beth Valdez ([id number]) [id number] Page 2 of 4 -------------------------------------------------------------------------------- Education Screening Details Patient Name: Date of Service: Beth Valdez NA 08/19/2022 12:45 PM Medical Record Number: [id number] Patient Account Number: [id number] Date of Birth/Sex: Treating RN: March 30, 2000 (22 y.o. Beth Valdez Primary Care Beth Valdez: Beth Valdez Valdez Other Clinician: Referring Beth Valdez: Treating Beth Valdez/Extender: Beth Valdez Weeks in Treatment: 0 Primary Learner Assessed: Patient Learning Preferences/Education Level/Primary Language Learning Preference: Explanation, Demonstration, Video, Printed Material Highest Education Level: College or Above Preferred Language: English Cognitive Barrier Language Barrier: No Translator Needed: No Memory Deficit: No Emotional Barrier: No Cultural/Religious Beliefs Affecting Medical Care: No Physical Barrier Impaired Vision: No Impaired Hearing: No Decreased Hand dexterity: No Knowledge/Comprehension Knowledge Level: Medium Comprehension Level: Medium Ability to understand written instructions: Medium Ability to understand verbal instructions: Medium Motivation Anxiety Level: Calm Cooperation: Cooperative Education Importance: Acknowledges Need Interest in Health Problems: Asks Questions Perception: Coherent Willingness to Engage in Self-Management Medium Activities: Readiness to Engage in Self-Management Medium Activities: Electronic Signature(s) Signed: 08/19/2022 4:16:02 PM By: Samuella Bruin Entered By: Samuella Bruin on 08/19/2022 13:05:51 -------------------------------------------------------------------------------- Fall Risk Assessment Details Patient Name: Date of Service: Beth Valdez NA 08/19/2022 12:45 PM Medical Record Number:  [id number] Patient Account Number: [id number] Date of Birth/Sex: Treating RN: [birth year] (22 y.o. Beth Valdez Primary Care Beth Valdez: Beth Valdez Valdez Other Clinician: Referring Beth Valdez: Treating Beth Valdez/Extender: Beth Valdez Weeks in Treatment: 0 Fall Risk Assessment Items Have you had 2 or more falls in the last [address] 0 No [place], [place] ([id number]) [phone] [id number] Page 3 of 4 Have you had any fall that resulted in injury in the last 12 monthso 0 No FALLS RISK SCREEN History of falling - immediate or within 3 months 0 No Secondary diagnosis (Do you have 2 or more medical diagnoseso)  0 No Ambulatory aid None/bed rest/wheelchair/nurse 0 Yes Crutches/cane/walker 0 No Furniture 0 No Intravenous therapy Access/Saline/Heparin Lock 0 No Gait/Transferring Normal/ bed rest/ wheelchair 0 Yes Weak (short steps with or without shuffle, stooped but able to lift head while walking, may seek 0 No support from furniture) Impaired (short steps with shuffle, may have difficulty arising from chair, head down, impaired 0 No balance) Mental Status Oriented to own ability 0 Yes Electronic Signature(s) Signed: 08/19/2022 4:16:02 PM By: Samuella Bruin Entered By: Samuella Bruin on 08/19/2022 13:05:58 -------------------------------------------------------------------------------- Foot Assessment Details Patient Name: Date of Service: Beth Valdez NA 08/19/2022 12:45 PM Medical Record Number: [id number] Patient Account Number: [id number] Date of Birth/Sex: Treating RN: Mar 07, 2000 (22 y.o. Beth Valdez Primary Care Kilan Banfill: Beth Valdez Valdez Other Clinician: Referring Beth Valdez: Treating Beth Valdez/Extender: Beth Valdez Weeks in Treatment: 0 Foot Assessment Items Site Locations + = Sensation present, - = Sensation absent, C = Callus, U = Ulcer R = Redness, W = Warmth, M = Maceration, PU = Pre-ulcerative lesion F =  Fissure, S = Swelling, Valdez = Dryness Assessment Right: Left: Other Deformity: No No Prior Foot Ulcer: No No Prior Amputation: No No Charcot Joint: No No Ambulatory Status: Ambulatory Without Help GaitArali Valdez, Ascencion Dike ([id number]) [phone] [id number] Page 4 of 4 Notes no hx of diabetes or neuropathy Electronic Signature(s) Signed: 08/19/2022 4:16:02 PM By: Samuella Bruin Entered By: Samuella Bruin on 08/19/2022 13:06:35 -------------------------------------------------------------------------------- Nutrition Risk Screening Details Patient Name: Date of Service: Beth Valdez NA 08/19/2022 12:45 PM Medical Record Number: [id number] Patient Account Number: [id number] Date of Birth/Sex: Treating RN: [birth year] (22 y.o. Beth Valdez Primary Care Zaidan Keeble: Beth Valdez Valdez Other Clinician: Referring Eriyanna Kofoed: Treating Baxter Gonzalez/Extender: Beth Valdez Weeks in Treatment: 0 Height (in): 68 Weight (lbs): 196 Body Mass Index (BMI): 29.8 Nutrition Risk Screening Items Score Screening NUTRITION RISK SCREEN: I have an illness or condition that made me change the kind and/or amount of food I eat 0 No I eat fewer than two meals per day 0 No I eat few fruits and vegetables, or milk products 0 No I have three or more drinks of beer, liquor or wine almost every day 0 No I have tooth or mouth problems that make it hard for me to eat 0 No I don't always have enough money to buy the food I need 0 No I eat alone most of the time 0 No I take three or more different prescribed or over-the-counter drugs a day 0 No Without wanting to, I have lost or gained 10 pounds in the last six months 0 No I am not always physically able to shop, cook and/or feed myself 0 No Nutrition Protocols Good Risk Protocol 0 No interventions needed Moderate Risk Protocol High Risk Proctocol Risk Level: Good Risk Score: 0 Electronic Signature(s) Signed: 08/19/2022  4:16:02 PM By: Samuella Bruin Entered By: Samuella Bruin on 08/19/2022 13:06:18

## 2022-08-19 NOTE — Progress Notes (Signed)
[place], Ascencion Dike ([id number]) [id number] Page 1 of 8 Visit Report for 08/19/2022 Allergy List Details Patient Name: Date of Service: Beth Valdez NA 08/19/2022 12:45 PM Medical Record Number: [id number] Patient Account Number: [id number] Date of Birth/Sex: Treating RN: [birth year] (22 y.o. Beth Valdez Beth Valdez: Beth Valdez Other Clinician: Referring Beth Valdez: Treating Beth Valdez/Extender: Beth Valdez INC, TRIA Valdez Weeks in Treatment: 0 Allergies Active Allergies No Known Allergies Allergy Notes Electronic Signature(s) Signed: 08/19/2022 4:16:02 PM By: Samuella Bruin Entered By: Samuella Bruin on 08/19/2022 09:14:36 -------------------------------------------------------------------------------- Arrival Information Details Patient Name: Date of Service: Beth Valdez NA 08/19/2022 12:45 PM Medical Record Number: [id number] Patient Account Number: [id number] Date of Birth/Sex: Treating RN: Beth Valdez (22 y.o. Beth Valdez Beth Valdez: Beth Valdez Other Clinician: Referring Beth Valdez: Treating Beth Valdez/Extender: Beth Valdez INC, TRIA Valdez Weeks in Treatment: 0 Visit Information Patient Arrived: Ambulatory Arrival Time: 13:02 Accompanied By: self Transfer Assistance: None Patient Identification Verified: Yes Secondary Verification Process Completed: Yes Patient Requires Transmission-Based Precautions: No Patient Has Alerts: No Electronic Signature(s) Signed: 08/19/2022 4:16:02 PM By: Samuella Bruin Entered By: Samuella Bruin on 08/19/2022 13:02:41 -------------------------------------------------------------------------------- Clinic Level of Valdez Assessment Details Patient Name: Date of Service: Beth Valdez NA 08/19/2022 12:45 PM Medical Record Number: [id number] Patient Account Number: [id number] Beth Valdez ([id number]) [id number] Page 2 of 8 Date of Birth/Sex:  Treating RN: Beth Valdez (22 y.o. Beth Valdez Beth Valdez: Other Clinician: Clovia Cuff Valdez Referring Anysia Choi: Treating Marvelous Bouwens/Extender: Beth Valdez INC, TRIA Valdez Weeks in Treatment: 0 Clinic Level of Valdez Assessment Items TOOL 1 Quantity Score X- 1 0 Use when EandM and Procedure is performed on INITIAL visit ASSESSMENTS - Nursing Assessment / Reassessment X- 1 20 General Physical Exam (combine w/ comprehensive assessment (listed just below) when performed on new pt. evals) X- 1 25 Comprehensive Assessment (HX, ROS, Risk Assessments, Wounds Hx, etc.) ASSESSMENTS - Wound and Skin Assessment / Reassessment []  - 0 Dermatologic / Skin Assessment (not related to wound area) ASSESSMENTS - Ostomy and/or Continence Assessment and Valdez []  - 0 Incontinence Assessment and Management []  - 0 Ostomy Valdez Assessment and Management (repouching, etc.) PROCESS - Coordination of Valdez X - Simple Patient / Family Education for ongoing Valdez 1 15 []  - 0 Complex (extensive) Patient / Family Education for ongoing Valdez X- 1 10 Staff obtains Chiropractor, Records, T Results / Process Orders est []  - 0 Staff telephones HHA, Nursing Homes / Clarify orders / etc []  - 0 Routine Transfer to another Facility (non-emergent condition) []  - 0 Routine Hospital Admission (non-emergent condition) X- 1 15 New Admissions / Manufacturing engineer / Ordering NPWT Apligraf, etc. , []  - 0 Emergency Hospital Admission (emergent condition) PROCESS - Special Needs []  - 0 Pediatric / Minor Patient Management []  - 0 Isolation Patient Management []  - 0 Hearing / Language / Visual special needs []  - 0 Assessment of Community assistance (transportation, Valdez/C planning, etc.) []  - 0 Additional assistance / Altered mentation []  - 0 Support Surface(s) Assessment (bed, cushion, seat, etc.) INTERVENTIONS - Miscellaneous []  - 0 External ear exam []  - 0 Patient Transfer (multiple staff / Nurse, adult /  Similar devices) []  - 0 Simple Staple / Suture removal (25 or less) []  - 0 Complex Staple / Suture removal (26 or more) []  - 0 Hypo/Hyperglycemic Management (do not check if billed separately) []  - 0 Ankle / Brachial Index (ABI) - do not check if billed separately Has the patient been seen at  the hospital within the last three years: Yes Total Score: 85 Level Of Valdez: New/Established - Level 3 Electronic Signature(s) Signed: 08/19/2022 4:16:02 PM By: Samuella Bruin Entered By: Samuella Bruin on 08/19/2022 13:38:05 [name] ([id number]) [id number] Page 3 of 8 -------------------------------------------------------------------------------- Encounter Discharge Information Details Patient Name: Date of Service: Beth Valdez NA 08/19/2022 12:45 PM Medical Record Number: [id number] Patient Account Number: [id number] Date of Birth/Sex: Treating RN: Val[birth year] (22 y.o. Beth Valdez Beth Valdez: Beth Valdez Other Clinician: Referring Beth Valdez: Treating Beth Valdez: Beth Valdez INC, TRIA Valdez Weeks in Treatment: 0 Encounter Discharge Information Items Post Procedure Vitals Discharge Condition: Stable Temperature (F): 98.2 Ambulatory Status: Ambulatory Pulse (bpm): 80 Discharge Destination: Home Respiratory Rate (breaths/min): 16 Transportation: Private Auto Blood Pressure (mmHg): 107/51 Accompanied By: self Schedule Follow-up Appointment: Yes Clinical Summary of Valdez: Patient Declined Electronic Signature(s) Signed: 08/19/2022 4:16:02 PM By: Samuella Bruin Entered By: Samuella Bruin on 08/19/2022 13:38:41 -------------------------------------------------------------------------------- Lower Extremity Assessment Details Patient Name: Date of Service: Beth Valdez NA 08/19/2022 12:45 PM Medical Record Number: [id number] Patient Account Number: [id number] Date of Birth/Sex: Treating RN: [birth year] (22 y.o. Beth Valdez Belkis Norbeck: Beth Valdez Other Clinician: Referring Juanpablo Ciresi: Treating Corynn Solberg/Extender: Beth Valdez INC, TRIA Valdez Weeks in Treatment: 0 Edema Assessment Assessed: [Left: No] [Right: No] [Left: Edema] [Right: :] Calf Left: Right: Point of Measurement: From Medial Instep 42 cm Ankle Left: Right: Point of Measurement: From Medial Instep 23 cm Vascular Assessment Pulses: Dorsalis Pedis Palpable: [Right:Yes] Electronic Signature(s) Signed: 08/19/2022 4:16:02 PM By: Samuella Bruin Entered By: Samuella Bruin on 08/19/2022 13:07:38 Multi Wound Chart Details -------------------------------------------------------------------------------- [name] ([id number]) [id number] Page 4 of 8 Patient Name: Date of Service: Beth Valdez NA 08/19/2022 12:45 PM Medical Record Number: [id number] Patient Account Number: [id number] Date of Birth/Sex: Treating RN: [birth year] Valdez (22 y.o. F) Primary Valdez Alezander Dimaano: Beth Valdez Other Clinician: Referring Braelee Herrle: Treating Trinika Cortese/Extender: Beth Valdez INC, TRIA Valdez Weeks in Treatment: 0 Vital Signs Height(in): 68 Pulse(bpm): 80 Weight(lbs): 196 Blood Pressure(mmHg): 107/51 Body Mass Index(BMI): 29.8 Temperature(F): 98.2 Respiratory Rate(breaths/min): 16 Wound Assessments Wound Number: 1 N/A N/A Photos: No Photos N/A N/A Right, Anterior Lower Leg N/A N/A Wound Location: Trauma N/A N/A Wounding Event: Trauma, Other N/A N/A Primary Etiology: 07/17/2022 N/A N/A Date Acquired: 0 N/A N/A Weeks of Treatment: Open N/A N/A Wound Status: No N/A N/A Wound Recurrence: 1.2x1.1x0.1 N/A N/A Measurements L x W x Valdez (cm) 1.037 N/A N/A A (cm) : rea 0.104 N/A N/A Volume (cm) : Full Thickness Without Exposed N/A N/A Classification: Support Structures None Present N/A N/A Exudate A mount: Distinct, outline attached N/A N/A Wound Margin: Large (67-100%) N/A N/A Granulation A  mount: Red N/A N/A Granulation Quality: Small (1-33%) N/A N/A Necrotic A mount: Eschar N/A N/A Necrotic Tissue: Fat Layer (Subcutaneous Tissue): Yes N/A N/A Exposed Structures: Fascia: No Tendon: No Muscle: No Joint: No Bone: No None N/A N/A Epithelialization: Debridement - Selective/Open Wound N/A N/A Debridement: Pre-procedure Verification/Time Out 13:16 N/A N/A Taken: Lidocaine 4% Topical Solution N/A N/A Pain Control: Necrotic/Eschar, Slough N/A N/A Tissue Debrided: Non-Viable Tissue N/A N/A Level: 1.04 N/A N/A Debridement A (sq cm): rea Curette N/A N/A Instrument: Minimum N/A N/A Bleeding: Pressure N/A N/A Hemostasis A chieved: Procedure was tolerated well N/A N/A Debridement Treatment Response: 1.2x1.1x0.1 N/A N/A Post Debridement Measurements L x W x Valdez (cm) 0.104 N/A N/A Post Debridement Volume: (cm) Scarring: Yes N/A N/A Periwound Skin Texture: No Abnormalities Noted  N/A N/A Periwound Skin Moisture: No Abnormalities Noted N/A N/A Periwound Skin Color: No Abnormality N/A N/A Temperature: Debridement N/A N/A Procedures Performed: Treatment Notes Wound #1 (Lower Leg) Wound Laterality: Right, Anterior Cleanser Soap and Water Discharge Instruction: Beth shower and wash wound with dial antibacterial soap and water prior to dressing change. Wound Cleanser Discharge Instruction: Cleanse the wound with wound cleanser prior to applying a clean dressing using gauze sponges, not tissue or cotton balls. Peri-Wound Valdez Topical Primary Dressing Promogran Prisma Matrix, 4.34 (sq in) (silver collagen) Discharge Instruction: Moisten collagen with saline or hydrogel [place], [facility] Dike ([id number]) [id number] Page 5 of 8 Secondary Dressing Zetuvit Plus Silicone Border Dressing 4x4 (in/in) Discharge Instruction: Apply silicone border over primary dressing as directed. Secured With Compression Wrap Compression  Stockings Facilities manager) Signed: 08/19/2022 1:55:39 PM By: Beth Guess MD FACS Entered By: Beth Valdez on 08/19/2022 13:55:39 -------------------------------------------------------------------------------- Multi-Disciplinary Valdez Plan Details Patient Name: Date of Service: Beth Valdez NA 08/19/2022 12:45 PM Medical Record Number: [id number] Patient Account Number: [id number] Date of Birth/Sex: Treating RN: [birth year]Valdez (22 y.o. Beth Valdez Derrisha Foos: Beth Valdez Other Clinician: Referring Amelya Mabry: Treating Deejay Koppelman/Extender: Beth Valdez INC, TRIA Valdez Weeks in Treatment: 0 Active Inactive Orientation to the Wound Valdez Program Nursing Diagnoses: Knowledge deficit related to the wound healing center program Goals: Patient/caregiver will verbalize understanding of the Wound Healing Center Program Date Initiated: 08/19/2022 Target Resolution Date: 09/25/2022 Goal Status: Active Interventions: Provide education on orientation to the wound center Notes: Wound/Skin Impairment Nursing Diagnoses: Impaired tissue integrity Knowledge deficit related to ulceration/compromised skin integrity Goals: Patient/caregiver will verbalize understanding of skin Valdez regimen Date Initiated: 08/19/2022 Target Resolution Date: 10/23/2022 Goal Status: Active Interventions: Assess patient/caregiver ability to obtain necessary supplies Assess patient/caregiver ability to perform ulcer/skin Valdez regimen upon admission and as needed Assess ulceration(s) every visit Treatment Activities: Skin Valdez regimen initiated : 08/19/2022 Topical wound management initiated : 08/19/2022 Notes: Electronic Signature(s) [place], Ascencion Dike ([id number]) [id number] Page 6 of 8 Signed: 08/19/2022 4:16:02 PM By: Gelene Mink By: Samuella Bruin on 08/19/2022  13:37:29 -------------------------------------------------------------------------------- Pain Assessment Details Patient Name: Date of Service: Beth Valdez NA 08/19/2022 12:45 PM Medical Record Number: [id number] Patient Account Number: [id number] Date of Birth/Sex: Treating RN: [birth year] Valdez (22 y.o. Beth Valdez Aalijah Mims: Beth Valdez Other Clinician: Referring Telesforo Brosnahan: Treating Keileigh Vahey/Extender: Darnelle Spangle, TRIA Valdez Weeks in Treatment: 0 Active Problems Location of Pain Severity and Description of Pain Patient Has Paino No Site Locations Rate the pain. Current Pain Level: 0 Pain Management and Medication Current Pain Management: Electronic Signature(s) Signed: 08/19/2022 4:16:02 PM By: Samuella Bruin Entered By: Samuella Bruin on 08/19/2022 13:09:15 -------------------------------------------------------------------------------- Patient/Caregiver Education Details Patient Name: Date of Service: Beth Valdez NA 7/3/2024andnbsp12:45 PM Medical Record Number: [id number] Patient Account Number: [id number] Date of Birth/Gender: Treating RN: Valdez[birth year] (22 y.o. Beth Valdez Physician: Beth Valdez Other Clinician: Referring Physician: Treating Physician/Extender: Ulyses Amor Weeks in Treatment: 0 Education Assessment Education Provided To: Patient KIEYA, SANDOR ([id number]) [id number] Page 7 of 8 Education Topics Provided Wound Debridement: Methods: Explain/Verbal Responses: Reinforcements needed, State content correctly Wound/Skin Impairment: Methods: Explain/Verbal Responses: Reinforcements needed, State content correctly Electronic Signature(s) Signed: 08/19/2022 4:16:02 PM By: Samuella Bruin Entered By: Samuella Bruin on 08/19/2022 13:37:39 -------------------------------------------------------------------------------- Wound Assessment Details Patient Name:  Date of Service: Beth Valdez NA 08/19/2022 12:45 PM Medical Record Number: [id number] Patient Account Number: [id number] Date of Birth/Sex: Treating  RN: [birth year]Valdez (22 y.o. Beth Valdez Aleaya Latona: Beth Valdez Other Clinician: Referring Geramy Lamorte: Treating Emmary Culbreath/Extender: Beth Valdez INC, TRIA Valdez Weeks in Treatment: 0 Wound Status Wound Number: 1 Primary Etiology: Trauma, Other Wound Location: Right, Anterior Lower Leg Wound Status: Open Wounding Event: Trauma Date Acquired: 07/17/2022 Weeks Of Treatment: 0 Clustered Wound: No Wound Measurements Length: (cm) 1.2 Width: (cm) 1.1 Depth: (cm) 0.1 Area: (cm) 1.037 Volume: (cm) 0.104 % Reduction in Area: % Reduction in Volume: Epithelialization: None Tunneling: No Undermining: No Wound Description Classification: Full Thickness Without Exposed Support Structures Wound Margin: Distinct, outline attached Exudate Amount: None Present Foul Odor After Cleansing: No Slough/Fibrino No Wound Bed Granulation Amount: Large (67-100%) Exposed Structure Granulation Quality: Red Fascia Exposed: No Necrotic Amount: Small (1-33%) Fat Layer (Subcutaneous Tissue) Exposed: Yes Necrotic Quality: Eschar Tendon Exposed: No Muscle Exposed: No Joint Exposed: No Bone Exposed: No Periwound Skin Texture Texture Color No Abnormalities Noted: No No Abnormalities Noted: Yes Scarring: Yes Temperature / Pain Temperature: No Abnormality Moisture No Abnormalities Noted: Yes Treatment Notes Wound #1 (Lower Leg) Wound Laterality: Right, Anterior Emilie Rutter, Ascencion Dike ([id number]) [id number] Page 8 of 8 Soap and Water Discharge Instruction: Beth shower and wash wound with dial antibacterial soap and water prior to dressing change. Wound Cleanser Discharge Instruction: Cleanse the wound with wound cleanser prior to applying a clean dressing using gauze sponges, not tissue or cotton  balls. Peri-Wound Valdez Topical Primary Dressing Promogran Prisma Matrix, 4.34 (sq in) (silver collagen) Discharge Instruction: Moisten collagen with saline or hydrogel Secondary Dressing Zetuvit Plus Silicone Border Dressing 4x4 (in/in) Discharge Instruction: Apply silicone border over primary dressing as directed. Secured With Compression Wrap Compression Stockings Facilities manager) Signed: 08/19/2022 4:16:02 PM By: Samuella Bruin Entered By: Samuella Bruin on 08/19/2022 13:09:02 -------------------------------------------------------------------------------- Vitals Details Patient Name: Date of Service: Beth Valdez NA 08/19/2022 12:45 PM Medical Record Number: [id number] Patient Account Number: [id number] Date of Birth/Sex: Treating RN: Valdez[birth year] (22 y.o. Beth Valdez Deniya Craigo: Beth Valdez Other Clinician: Referring Aerabella Galasso: Treating Janelle Spellman/Extender: Beth Valdez INC, TRIA Valdez Weeks in Treatment: 0 Vital Signs Time Taken: 13:02 Temperature (F): 98.2 Height (in): 68 Pulse (bpm): 80 Source: Stated Respiratory Rate (breaths/min): 16 Weight (lbs): 196 Blood Pressure (mmHg): 107/51 Source: Stated Reference Range: 80 - 120 mg / dl Body Mass Index (BMI): 29.8 Electronic Signature(s) Signed: 08/19/2022 4:16:02 PM By: Samuella Bruin Entered By: Samuella Bruin on 08/19/2022 13:03:11

## 2022-08-24 DIAGNOSIS — L97812 Non-pressure chronic ulcer of other part of right lower leg with fat layer exposed: Secondary | ICD-10-CM | POA: Diagnosis not present

## 2022-09-02 ENCOUNTER — Encounter (HOSPITAL_BASED_OUTPATIENT_CLINIC_OR_DEPARTMENT_OTHER): Payer: 59 | Admitting: Physician Assistant

## 2022-09-02 ENCOUNTER — Encounter (HOSPITAL_BASED_OUTPATIENT_CLINIC_OR_DEPARTMENT_OTHER): Payer: 59 | Admitting: General Surgery

## 2022-10-06 ENCOUNTER — Ambulatory Visit
Admission: EM | Admit: 2022-10-06 | Discharge: 2022-10-06 | Disposition: A | Discharge status: Home / Self Care | Payer: 59 | Attending: Internal Medicine | Admitting: Internal Medicine

## 2022-10-06 DIAGNOSIS — N898 Other specified noninflammatory disorders of vagina: Secondary | ICD-10-CM | POA: Insufficient documentation

## 2022-10-06 DIAGNOSIS — Z113 Encounter for screening for infections with a predominantly sexual mode of transmission: Secondary | ICD-10-CM | POA: Diagnosis not present

## 2022-10-06 NOTE — ED Provider Notes (Signed)
[name]    CSN: [id number] Arrival date & time: 10/06/22  1717      History   Chief Complaint Chief Complaint  Patient presents with   vaginal discomfort    HPI Beth Valdez is a 22 y.o. female.   Patient presents with white vaginal discharge and itching that started about a week ago.  Patient denies any other associated symptoms, abdominal pain, pelvic pain, flank pain, fever.  Patient denies exposure to STD but has had unprotected sexual intercourse with a female sexual partner.  Last menstrual cycle was approximately 2 to 3 weeks ago per patient report.     Past Medical History:  Diagnosis Date   History of gestational diabetes 09/30/2018   Medical history non-contributory    Right wrist fracture 08/29/2019   after fall    Patient Active Problem List   Diagnosis Date Noted   Indication for care in labor or delivery 09/30/2018   Gestational diabetes mellitus (GDM) affecting pregnancy 04/20/2018   Prediabetes 04/01/2018   Supervision of normal first teen pregnancy 03/22/2018   Chlamydia trachomatis infection during pregnancy in first trimester, antepartum 03/02/2018    Past Surgical History:  Procedure Laterality Date   NO PAST SURGERIES     ORIF WRIST FRACTURE Right 09/05/2019   Procedure: OPEN REDUCTION INTERNAL FIXATION (ORIF) WRIST FRACTURE;  Surgeon: Sheral Apley, MD;  Location: [facility];  Service: Orthopedics;  Laterality: Right;    OB History     Gravida  3   Para  1   Term  1   Preterm      AB  1   Living  1      SAB      IAB      Ectopic      Multiple  0   Live Births  1            Home Medications    Prior to Admission medications   Medication Sig Start Date End Date Taking? Authorizing Provider  chlorhexidine (HIBICLENS) 4 % external liquid Apply topically daily as needed. Cleanse daily, then cover with band aid 07/29/22   Valinda Hoar, NP  meloxicam (MOBIC) 7.5 MG tablet Take  1 tablet (7.5 mg total) by mouth daily. 07/29/22   Valinda Hoar, NP  mupirocin ointment (BACTROBAN) 2 % Apply 1 Application topically 2 (two) times daily. To affected area till better 08/13/22   Zenia Resides, MD  oxyCODONE-acetaminophen (PERCOCET/ROXICET) 5-325 MG tablet Take 1-2 tablets by mouth every 6 (six) hours as needed for severe pain. 07/18/22   Antony Madura, PA-C  traMADol (ULTRAM) 50 MG tablet Take 1 tablet (50 mg total) by mouth every 12 (twelve) hours as needed. 07/29/22   Valinda Hoar, NP    Family History Family History  Problem Relation Age of Onset   Hypertension Other    Hypertension Maternal Grandfather     Social History Social History   Tobacco Use   Smoking status: Never   Smokeless tobacco: Never  Vaping Use   Vaping status: Some Days  Substance Use Topics   Alcohol use: Yes    Comment: socially   Drug use: Not Currently    Types: Marijuana    Comment: today     Allergies   Patient has no known allergies.   Review of Systems Review of Systems Per HPI  Physical Exam Triage Vital Signs ED Triage Vitals  Encounter Vitals Group  BP 10/06/22 1723 (!) 116/58     Systolic BP Percentile --      Diastolic BP Percentile --      Pulse Rate 10/06/22 1723 61     Resp 10/06/22 1723 16     Temp 10/06/22 1723 98.3 F (36.8 C)     Temp Source 10/06/22 1723 Oral     SpO2 10/06/22 1723 99 %     Weight --      Height --      Head Circumference --      Peak Flow --      Pain Score 10/06/22 1727 0     Pain Loc --      Pain Education --      Exclude from Growth Chart --    No data found.  Updated Vital Signs BP (!) 116/58 (BP Location: Left Arm)   Pulse 61   Temp 98.3 F (36.8 C) (Oral)   Resp 16   LMP  (Within Weeks) Comment: 3 weeks  SpO2 99%   Visual Acuity Right Eye Distance:   Left Eye Distance:   Bilateral Distance:    Right Eye Near:   Left Eye Near:    Bilateral Near:     Physical Exam Constitutional:      General:  She is not in acute distress.    Appearance: Normal appearance. She is not toxic-appearing or diaphoretic.  HENT:     Head: Normocephalic and atraumatic.  Eyes:     Extraocular Movements: Extraocular movements intact.     Conjunctiva/sclera: Conjunctivae normal.  Pulmonary:     Effort: Pulmonary effort is normal.  Genitourinary:    Comments: Deferred with shared decision making. Self swab performed.  Neurological:     General: No focal deficit present.     Mental Status: She is alert and oriented to person, place, and time. Mental status is at baseline.  Psychiatric:        Mood and Affect: Mood normal.        Behavior: Behavior normal.        Thought Content: Thought content normal.        Judgment: Judgment normal.      UC Treatments / Results  Labs (all labs ordered are listed, but only abnormal results are displayed) Labs Reviewed  CERVICOVAGINAL ANCILLARY ONLY    EKG   Radiology No results found.  Procedures Procedures (including critical care time)  Medications Ordered in UC Medications - No data to display  Initial Impression / Assessment and Plan / UC Course  I have reviewed the triage vital signs and the nursing notes.  Pertinent labs & imaging results that were available during my care of the patient were reviewed by me and considered in my medical decision making (see chart for details).     Cervicovaginal swab pending.  Given no confirmed exposure to STD, will await result prior to treatment.  Advised to refrain from sexual activity until test results and treatment are complete.  Patient verbalized understanding and was agreeable with plan. Final Clinical Impressions(s) / UC Diagnoses   Final diagnoses:  Vaginal discharge  Screening examination for venereal disease     Discharge Instructions      Vaginal swab is pending.  We will call with results.    ED Prescriptions   None    PDMP not reviewed this encounter.   Gustavus Bryant,  [place] 10/06/22 [id number]

## 2022-10-06 NOTE — ED Triage Notes (Signed)
Pt reports white thick vaginal discharge, vaginal discomfort and vaginal itchy x 1 week.

## 2022-10-06 NOTE — Discharge Instructions (Signed)
 Vaginal swab is pending. We will call with results.

## 2022-10-07 LAB — CERVICOVAGINAL ANCILLARY ONLY
Bacterial Vaginitis (gardnerella): POSITIVE — AB
Candida Glabrata: NEGATIVE
Candida Vaginitis: POSITIVE — AB
Chlamydia: NEGATIVE
Comment: NEGATIVE
Comment: NEGATIVE
Comment: NEGATIVE
Comment: NEGATIVE
Comment: NEGATIVE
Comment: NORMAL
Neisseria Gonorrhea: NEGATIVE
Trichomonas: POSITIVE — AB

## 2022-10-08 ENCOUNTER — Telehealth: Payer: Self-pay

## 2022-10-08 MED ORDER — METRONIDAZOLE 500 MG PO TABS: 500.0000 mg | ORAL_TABLET | Freq: Two times a day (BID) | ORAL | 0 refills | Status: AC

## 2022-10-08 MED ORDER — FLUCONAZOLE 150 MG PO TABS: 150.0000 mg | ORAL_TABLET | Freq: Once | ORAL | 0 refills | Status: AC

## 2022-10-08 NOTE — Telephone Encounter (Signed)
Per protocol, pt requires tx with metronidazole and Diflucan.  Attempted to reach patient x1. Left message.  Rx sent to pharmacy on file.

## 2023-04-14 ENCOUNTER — Ambulatory Visit
Admission: EM | Admit: 2023-04-14 | Discharge: 2023-04-14 | Disposition: A | Discharge status: Home / Self Care | Payer: 59 | Attending: Physician Assistant | Admitting: Physician Assistant

## 2023-04-14 ENCOUNTER — Encounter: Payer: Self-pay | Admitting: *Deleted

## 2023-04-14 ENCOUNTER — Other Ambulatory Visit: Payer: Self-pay

## 2023-04-14 DIAGNOSIS — K529 Noninfective gastroenteritis and colitis, unspecified: Secondary | ICD-10-CM | POA: Diagnosis not present

## 2023-04-14 MED ORDER — ONDANSETRON 4 MG PO TBDP: 4.0000 mg | ORAL_TABLET | Freq: Once | ORAL | Status: DC

## 2023-04-14 MED ORDER — ONDANSETRON 4 MG PO TBDP: 4.0000 mg | ORAL_TABLET | Freq: Three times a day (TID) | ORAL | 0 refills | Status: DC | PRN

## 2023-04-14 MED ORDER — ONDANSETRON HCL 4 MG/2ML IJ SOLN
4.0000 mg | Freq: Once | INTRAMUSCULAR | Status: AC
  Administered 2023-04-14: 4 mg via INTRAMUSCULAR

## 2023-04-14 NOTE — ED Triage Notes (Signed)
 Pt reports "pooping a lot" on Monday- better yesterday- Today states "I keep pooping water - at least 10 times". Denies vomiting. Generalized abd pain also. States her son had same Sx 2 weeks ago. Denies recent antibiotics. Taking pepto-bismal without relief

## 2023-04-14 NOTE — ED Provider Notes (Signed)
 [name]    CSN: [id number] Arrival date & time: 04/14/23  0944      History   Chief Complaint Chief Complaint  Patient presents with   Abdominal Pain    HPI Beth Valdez is a 23 y.o. female.   Patient here today for evaluation of generalized abdominal pain, nausea, occasional vomiting and diarrhea.  She reports her son had similar a couple weeks ago.  She notes stools are watery but denies any blood in her stool.  She has not had any fever.  Symptoms started 2 days ago and have waxed and waned.  The history is provided by the patient.  Abdominal Pain Associated symptoms: diarrhea, nausea and vomiting   Associated symptoms: no chills, no cough, no fever and no shortness of breath     Past Medical History:  Diagnosis Date   History of gestational diabetes 09/30/2018   Medical history non-contributory    Right wrist fracture 08/29/2019   after fall    Patient Active Problem List   Diagnosis Date Noted   Indication for care in labor or delivery 09/30/2018   Gestational diabetes mellitus (GDM) affecting pregnancy 04/20/2018   Prediabetes 04/01/2018   Supervision of normal first teen pregnancy 03/22/2018   Chlamydia trachomatis infection during pregnancy in first trimester, antepartum 03/02/2018    Past Surgical History:  Procedure Laterality Date   NO PAST SURGERIES     ORIF WRIST FRACTURE Right 09/05/2019   Procedure: OPEN REDUCTION INTERNAL FIXATION (ORIF) WRIST FRACTURE;  Surgeon: Sheral Apley, MD;  Location: [facility];  Service: Orthopedics;  Laterality: Right;    OB History     Gravida  3   Para  1   Term  1   Preterm      AB  1   Living  1      SAB      IAB      Ectopic      Multiple  0   Live Births  1            Home Medications    Prior to Admission medications   Medication Sig Start Date End Date Taking? Authorizing Provider  ondansetron (ZOFRAN-ODT) 4 MG disintegrating tablet Take 1  tablet (4 mg total) by mouth every 8 (eight) hours as needed. 04/14/23  Yes Tomi Bamberger, PA-C  chlorhexidine (HIBICLENS) 4 % external liquid Apply topically daily as needed. Cleanse daily, then cover with band aid 07/29/22   Valinda Hoar, NP  meloxicam (MOBIC) 7.5 MG tablet Take 1 tablet (7.5 mg total) by mouth daily. 07/29/22   Valinda Hoar, NP  mupirocin ointment (BACTROBAN) 2 % Apply 1 Application topically 2 (two) times daily. To affected area till better 08/13/22   Zenia Resides, MD  oxyCODONE-acetaminophen (PERCOCET/ROXICET) 5-325 MG tablet Take 1-2 tablets by mouth every 6 (six) hours as needed for severe pain. 07/18/22   Antony Madura, PA-C  traMADol (ULTRAM) 50 MG tablet Take 1 tablet (50 mg total) by mouth every 12 (twelve) hours as needed. 07/29/22   Valinda Hoar, NP    Family History Family History  Problem Relation Age of Onset   Hypertension Maternal Grandfather    Hypertension Other     Social History Social History   Tobacco Use   Smoking status: Never   Smokeless tobacco: Never  Vaping Use   Vaping status: Some Days  Substance Use Topics   Alcohol use: Yes  Comment: socially   Drug use: Yes    Types: Marijuana    Comment: today     Allergies   Patient has no known allergies.   Review of Systems Review of Systems  Constitutional:  Negative for chills and fever.  HENT:  Negative for congestion and rhinorrhea.   Eyes:  Negative for discharge and redness.  Respiratory:  Negative for cough and shortness of breath.   Gastrointestinal:  Positive for abdominal pain, diarrhea, nausea and vomiting. Negative for blood in stool.     Physical Exam Triage Vital Signs ED Triage Vitals  Encounter Vitals Group     BP 04/14/23 1035 100/62     Systolic BP Percentile --      Diastolic BP Percentile --      Pulse Rate 04/14/23 1035 82     Resp 04/14/23 1035 18     Temp 04/14/23 1035 98.6 F (37 C)     Temp Source 04/14/23 1035 Oral     SpO2  04/14/23 1035 98 %     Weight --      Height --      Head Circumference --      Peak Flow --      Pain Score 04/14/23 1030 8     Pain Loc --      Pain Education --      Exclude from Growth Chart --    No data found.  Updated Vital Signs BP 100/62 (BP Location: Right Arm)   Pulse 82   Temp 98.6 F (37 C) (Oral)   Resp 18   LMP 03/24/2023 (Approximate)   SpO2 98%   Visual Acuity Right Eye Distance:   Left Eye Distance:   Bilateral Distance:    Right Eye Near:   Left Eye Near:    Bilateral Near:     Physical Exam Vitals and nursing note reviewed.  Constitutional:      General: She is not in acute distress.    Appearance: Normal appearance. She is not ill-appearing.  HENT:     Head: Normocephalic and atraumatic.  Eyes:     Conjunctiva/sclera: Conjunctivae normal.  Cardiovascular:     Rate and Rhythm: Normal rate and regular rhythm.  Pulmonary:     Effort: Pulmonary effort is normal. No respiratory distress.     Breath sounds: Normal breath sounds. No wheezing, rhonchi or rales.  Abdominal:     General: Abdomen is flat. Bowel sounds are normal. There is no distension.     Palpations: Abdomen is soft.     Tenderness: There is no abdominal tenderness. There is no guarding or rebound.  Neurological:     Mental Status: She is alert.  Psychiatric:        Mood and Affect: Mood normal.        Behavior: Behavior normal.        Thought Content: Thought content normal.      UC Treatments / Results  Labs (all labs ordered are listed, but only abnormal results are displayed) Labs Reviewed - No data to display  EKG   Radiology No results found.  Procedures Procedures (including critical care time)  Medications Ordered in UC Medications  ondansetron (ZOFRAN-ODT) disintegrating tablet 4 mg (has no administration in time range)    Initial Impression / Assessment and Plan / UC Course  I have reviewed the triage vital signs and the nursing notes.  Pertinent  labs & imaging results that were available during my care  of the patient were reviewed by me and considered in my medical decision making (see chart for details).    Suspect likely viral gastroenteritis and will treat with Zofran and advised increased oral fluid intake with electrolyte replacement and bland diet.  Recommended follow-up if no gradual improvement over the next few days or sooner follow-up in the emergency room with any worsening abdominal pain or symptoms.  Patient expresses understanding.  Final Clinical Impressions(s) / UC Diagnoses   Final diagnoses:  Gastroenteritis   Discharge Instructions   None    ED Prescriptions     Medication Sig Dispense Auth. Provider   ondansetron (ZOFRAN-ODT) 4 MG disintegrating tablet Take 1 tablet (4 mg total) by mouth every 8 (eight) hours as needed. 20 tablet Tomi Bamberger, PA-C      PDMP not reviewed this encounter.   Tomi Bamberger, PA-C 04/14/23 1122

## 2023-08-04 ENCOUNTER — Other Ambulatory Visit: Payer: Self-pay

## 2023-08-04 ENCOUNTER — Ambulatory Visit
Admission: EM | Admit: 2023-08-04 | Discharge: 2023-08-04 | Disposition: A | Discharge status: Home / Self Care | Payer: Self-pay | Attending: Internal Medicine | Admitting: Internal Medicine

## 2023-08-04 DIAGNOSIS — Z113 Encounter for screening for infections with a predominantly sexual mode of transmission: Secondary | ICD-10-CM | POA: Diagnosis present

## 2023-08-04 DIAGNOSIS — S39012A Strain of muscle, fascia and tendon of lower back, initial encounter: Secondary | ICD-10-CM | POA: Diagnosis present

## 2023-08-04 DIAGNOSIS — N898 Other specified noninflammatory disorders of vagina: Secondary | ICD-10-CM | POA: Diagnosis present

## 2023-08-04 HISTORY — DX: Accidental discharge from unspecified firearms or gun, initial encounter: W34.00XA

## 2023-08-04 LAB — POCT URINALYSIS DIP (MANUAL ENTRY)
Bilirubin, UA: NEGATIVE
Blood, UA: NEGATIVE
Glucose, UA: NEGATIVE mg/dL
Ketones, POC UA: NEGATIVE mg/dL
Leukocytes, UA: NEGATIVE
Nitrite, UA: NEGATIVE
Protein Ur, POC: NEGATIVE mg/dL
Spec Grav, UA: 1.025 (ref 1.010–1.025)
Urobilinogen, UA: 1 U/dL
pH, UA: 7 (ref 5.0–8.0)

## 2023-08-04 LAB — POCT URINE PREGNANCY: Preg Test, Ur: NEGATIVE

## 2023-08-04 MED ORDER — CYCLOBENZAPRINE HCL 5 MG PO TABS: 5.0000 mg | ORAL_TABLET | Freq: Three times a day (TID) | ORAL | 0 refills | Status: DC | PRN

## 2023-08-04 MED ORDER — FLUCONAZOLE 150 MG PO TABS: 150.0000 mg | ORAL_TABLET | Freq: Every day | ORAL | 0 refills | Status: AC

## 2023-08-04 MED ORDER — PREDNISONE 20 MG PO TABS: 40.0000 mg | ORAL_TABLET | Freq: Every day | ORAL | 0 refills | Status: AC

## 2023-08-04 NOTE — Discharge Instructions (Addendum)
 We have sent off a swab today to check for STIs.  These results will take 12 to 24 hours for final results.  Symptoms do seem most consistent with a vaginal yeast infection and we will call in Diflucan  for this.  The lower back symptoms seem most consistent with a musculoskeletal type issue.  The urinalysis done today was negative for infection and the pregnancy test were negative as well.  We will treat this with muscle relaxers and strong anti-inflammatories.  We will treat with the following: Flexeril  5 mg every 8 hours as needed for muscle spasms.  Use caution as this medication can cause drowsiness. Prednisone  40 mg (2 tablets) once daily for 5 days. Take this in the morning.  This is a steroid to help with inflammation and pain.  Okay to take Tylenol  while taking this medication but avoid taking ibuprofen  or Aleve. Diflucan  150 mg take 1 tablet now and then repeat in 3 days.  May alternate heat and ice on the lower back to help with symptoms. Recommend light stretching to help improve mobility Make sure to wear shoes that have good support while at work Return to urgent care or PCP if symptoms worsen or fail to resolve.
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
## 2023-08-04 NOTE — ED Triage Notes (Signed)
 Pt c/o white vaginal discharge, int vaginal itchingx1wk. Pt c/o lower back painx2d. Pt denies injury. Pt denies numbness or tingling. Pt denies loss of bowel or bladder.

## 2023-08-05 ENCOUNTER — Ambulatory Visit (HOSPITAL_COMMUNITY): Payer: Self-pay

## 2023-08-05 LAB — CERVICOVAGINAL ANCILLARY ONLY
Bacterial Vaginitis (gardnerella): POSITIVE — AB
Candida Glabrata: NEGATIVE
Candida Vaginitis: NEGATIVE
Chlamydia: POSITIVE — AB
Comment: NEGATIVE
Comment: NEGATIVE
Comment: NEGATIVE
Comment: NEGATIVE
Comment: NEGATIVE
Comment: NORMAL
Neisseria Gonorrhea: NEGATIVE
Trichomonas: NEGATIVE

## 2023-08-05 MED ORDER — METRONIDAZOLE 500 MG PO TABS: 500.0000 mg | ORAL_TABLET | Freq: Two times a day (BID) | ORAL | 0 refills | Status: DC

## 2023-08-05 MED ORDER — DOXYCYCLINE HYCLATE 100 MG PO TABS: 100.0000 mg | ORAL_TABLET | Freq: Two times a day (BID) | ORAL | 0 refills | Status: DC

## 2023-08-09 ENCOUNTER — Telehealth (HOSPITAL_COMMUNITY): Payer: Self-pay

## 2023-08-09 MED ORDER — METRONIDAZOLE 500 MG PO TABS: 500.0000 mg | ORAL_TABLET | Freq: Two times a day (BID) | ORAL | 0 refills | Status: AC

## 2023-08-09 MED ORDER — DOXYCYCLINE HYCLATE 100 MG PO TABS: 100.0000 mg | ORAL_TABLET | Freq: Two times a day (BID) | ORAL | 0 refills | Status: AC

## 2023-08-09 NOTE — Telephone Encounter (Signed)
Pharmacy updated per pt request.

## 2023-09-08 ENCOUNTER — Inpatient Hospital Stay
Admission: RE | Admit: 2023-09-08 | Discharge: 2023-09-08 | Disposition: A | Discharge status: Home / Self Care | Payer: Self-pay | Source: Ambulatory Visit | Attending: Pediatrics | Admitting: Pediatrics

## 2023-09-09 ENCOUNTER — Ambulatory Visit
Admission: EM | Admit: 2023-09-09 | Discharge: 2023-09-09 | Disposition: A | Discharge status: Home / Self Care | Source: Ambulatory Visit | Attending: Family Medicine | Admitting: Family Medicine

## 2023-09-09 DIAGNOSIS — N760 Acute vaginitis: Secondary | ICD-10-CM | POA: Insufficient documentation

## 2023-09-09 MED ORDER — FLUCONAZOLE 150 MG PO TABS: 150.0000 mg | ORAL_TABLET | ORAL | 0 refills | Status: DC

## 2023-09-09 NOTE — ED Triage Notes (Signed)
 Pt c/o vaginal d/c and irritation x 9 days-used OTC monistat with no change/states the cream increased irritation-NA-steady gait
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
## 2023-09-10 ENCOUNTER — Ambulatory Visit (HOSPITAL_COMMUNITY): Payer: Self-pay

## 2023-09-10 LAB — CERVICOVAGINAL ANCILLARY ONLY
Bacterial Vaginitis (gardnerella): POSITIVE — AB
Candida Glabrata: NEGATIVE
Candida Vaginitis: POSITIVE — AB
Chlamydia: NEGATIVE
Comment: NEGATIVE
Comment: NEGATIVE
Comment: NEGATIVE
Comment: NEGATIVE
Comment: NEGATIVE
Comment: NORMAL
Neisseria Gonorrhea: NEGATIVE
Trichomonas: NEGATIVE

## 2023-10-08 ENCOUNTER — Other Ambulatory Visit: Payer: Self-pay

## 2023-10-08 ENCOUNTER — Ambulatory Visit
Admission: EM | Admit: 2023-10-08 | Discharge: 2023-10-08 | Disposition: A | Discharge status: Home / Self Care | Attending: Family Medicine | Admitting: Family Medicine

## 2023-10-08 DIAGNOSIS — R52 Pain, unspecified: Secondary | ICD-10-CM

## 2023-10-08 DIAGNOSIS — S81831D Puncture wound without foreign body, right lower leg, subsequent encounter: Secondary | ICD-10-CM | POA: Diagnosis not present

## 2023-10-08 DIAGNOSIS — N926 Irregular menstruation, unspecified: Secondary | ICD-10-CM

## 2023-10-08 DIAGNOSIS — T148XXA Other injury of unspecified body region, initial encounter: Secondary | ICD-10-CM

## 2023-10-08 LAB — POCT URINE PREGNANCY: Preg Test, Ur: NEGATIVE

## 2023-10-08 MED ORDER — NAPROXEN 500 MG PO TABS
500.0000 mg | ORAL_TABLET | Freq: Two times a day (BID) | ORAL | 0 refills | Status: DC | PRN
Start: 2023-10-08 — End: 2023-11-16
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
## 2023-10-08 NOTE — Discharge Instructions (Signed)
 You may take naproxen  twice daily as needed.  Get over-the-counter compression stockings to wear especially while you are at work as she stands for periods of time.  Continue elevation of your legs as needed.  Your urine hCG is negative in the clinic.  Please follow-up with your PCP if your symptoms do not improve.  Please go to the ER if you develop any worsening symptoms.  Hope you feel better soon!
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
## 2023-10-11 ENCOUNTER — Encounter: Payer: Self-pay | Admitting: Emergency Medicine

## 2023-10-11 ENCOUNTER — Ambulatory Visit
Admission: EM | Admit: 2023-10-11 | Discharge: 2023-10-11 | Disposition: A | Discharge status: Home / Self Care | Attending: Emergency Medicine | Admitting: Emergency Medicine

## 2023-10-11 DIAGNOSIS — S81831D Puncture wound without foreign body, right lower leg, subsequent encounter: Secondary | ICD-10-CM

## 2023-10-11 MED ORDER — METHOCARBAMOL 500 MG PO TABS: 500.0000 mg | ORAL_TABLET | Freq: Two times a day (BID) | ORAL | 0 refills | Status: DC
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
## 2023-11-16 ENCOUNTER — Encounter: Payer: Self-pay | Admitting: Emergency Medicine

## 2023-11-16 ENCOUNTER — Ambulatory Visit: Admission: EM | Admit: 2023-11-16 | Discharge: 2023-11-16 | Disposition: A | Discharge status: Home / Self Care

## 2023-11-16 DIAGNOSIS — J069 Acute upper respiratory infection, unspecified: Secondary | ICD-10-CM | POA: Diagnosis not present

## 2023-11-16 LAB — POC SOFIA SARS ANTIGEN FIA: SARS Coronavirus 2 Ag: NEGATIVE

## 2023-11-16 NOTE — ED Triage Notes (Signed)
 Pt presents c/o URI x 3 days. Pt reports cough, sneezing, headache, itchy throat and runny nose. Pt denies emesis and diarrhea.

## 2023-11-16 NOTE — Discharge Instructions (Signed)
 I recommend mucinex  (active ingredient is guaifenesin )  The double strength tablets which are 600 mg each Take two of them together (1,200 mg), twice daily Take with LOTS of fluids!  Continue other supportive care Allow 4-5 more days for improvement
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
## 2023-12-02 ENCOUNTER — Ambulatory Visit
Admission: EM | Admit: 2023-12-02 | Discharge: 2023-12-02 | Disposition: A | Discharge status: Home / Self Care | Attending: Family Medicine | Admitting: Family Medicine

## 2023-12-02 DIAGNOSIS — O219 Vomiting of pregnancy, unspecified: Secondary | ICD-10-CM

## 2023-12-02 DIAGNOSIS — Z3201 Encounter for pregnancy test, result positive: Secondary | ICD-10-CM | POA: Diagnosis not present

## 2023-12-02 LAB — POCT URINE PREGNANCY: Preg Test, Ur: POSITIVE — AB

## 2023-12-02 MED ORDER — DOXYLAMINE-PYRIDOXINE 10-10 MG PO TBEC: 1.0000 | DELAYED_RELEASE_TABLET | Freq: Two times a day (BID) | ORAL | 0 refills | Status: DC
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
## 2023-12-02 NOTE — Discharge Instructions (Addendum)
Your pregnancy test was positive and therefore I recommend seeking a consultation with an obstetrician.  If you intend on keeping the pregnancy, please start a prenatal vitamin.  Avoid any alcohol use.  Do not use any nonsteroidal anti-inflammatories (NSAIDs) like ibuprofen, Motrin, naproxen, Aleve, etc. which are all available over-the-counter.  Please just use Tylenol at a dose of 500mg -650mg  once every 6 hours as needed for your aches, pains, fevers.

## 2023-12-02 NOTE — ED Triage Notes (Signed)
 Pt requesting preg test-states she is craving foods, nausea, hot flashes and fatigue-LMP 9/19-NAD-steady gait

## 2023-12-16 ENCOUNTER — Ambulatory Visit: Admission: EM | Admit: 2023-12-16 | Discharge: 2023-12-16 | Disposition: A | Discharge status: Home / Self Care

## 2023-12-16 ENCOUNTER — Encounter: Payer: Self-pay | Admitting: Emergency Medicine

## 2023-12-16 DIAGNOSIS — O219 Vomiting of pregnancy, unspecified: Secondary | ICD-10-CM | POA: Diagnosis not present

## 2023-12-16 DIAGNOSIS — Z3A01 Less than 8 weeks gestation of pregnancy: Secondary | ICD-10-CM | POA: Diagnosis not present

## 2023-12-16 MED ORDER — METOCLOPRAMIDE HCL 10 MG PO TABS: 10.0000 mg | ORAL_TABLET | Freq: Three times a day (TID) | ORAL | 3 refills | Status: DC | PRN
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
## 2023-12-16 NOTE — Discharge Instructions (Addendum)
  1. Excessive vomiting in pregnancy (Primary) - metoCLOPramide  (REGLAN ) 10 MG tablet; Take 1 tablet (10 mg total) by mouth every 8 (eight) hours as needed for nausea or vomiting.  Dispense: 30 tablet; Refill: 3 - Ginger supplements or ginger ale may improve nausea symptoms and prevent further vomiting. - Discontinue use of of doxylamine-pyridoxine as prescribed for nausea and vomiting prior to beginning metoclopramide  therapy. - Follow-up with the OB/GYN as scheduled on November 17 if there is continued nausea and vomiting they can prescribe further therapy to manage symptoms. - If symptoms do not improve with metoclopramide  therapy follow-up in urgent care for further evaluation and treatment. - If you feel you are becoming dehydrated follow-up in children Center at [facility] for further evaluation and treatment.

## 2023-12-16 NOTE — ED Triage Notes (Signed)
 Patient states that she is currently pregnant and having extreme nausea.  Patient was given Doxylamine-Pyridoxine which has not helped patient.  Patient is requesting a rx for Zofran .  OBGYN appt on 01/03/2024.

## 2023-12-20 ENCOUNTER — Ambulatory Visit: Admission: EM | Admit: 2023-12-20 | Discharge: 2023-12-20 | Disposition: A | Discharge status: Home / Self Care

## 2023-12-20 DIAGNOSIS — Z3A01 Less than 8 weeks gestation of pregnancy: Secondary | ICD-10-CM

## 2023-12-20 DIAGNOSIS — O219 Vomiting of pregnancy, unspecified: Secondary | ICD-10-CM

## 2023-12-20 NOTE — Discharge Instructions (Addendum)
  1. Excessive vomiting in pregnancy (Primary) - Due to continued intractable vomiting with prescribed medication and during pregnancy recommend follow-up with women's and children's Center at [facility] for further evaluation and treatment. - If you truly are becoming dehydrated that can be harmful to development of the baby and you may need IV fluids, IV medication and monitoring to make sure that symptoms do not worsen. - Please go directly to women's and children Center at [facility] after leaving urgent care for further evaluation and treatment.

## 2023-12-20 NOTE — ED Notes (Addendum)
 Provider saw pt prior to triage with nurse. Discussed with provider, triage unnecessary.
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
## 2023-12-21 ENCOUNTER — Inpatient Hospital Stay (HOSPITAL_COMMUNITY)
Admission: AD | Admit: 2023-12-21 | Discharge: 2023-12-21 | Disposition: A | Discharge status: Home / Self Care | Attending: Obstetrics & Gynecology | Admitting: Obstetrics & Gynecology

## 2023-12-21 ENCOUNTER — Other Ambulatory Visit: Payer: Self-pay

## 2023-12-21 ENCOUNTER — Encounter (HOSPITAL_COMMUNITY): Payer: Self-pay | Admitting: Obstetrics & Gynecology

## 2023-12-21 DIAGNOSIS — Z87891 Personal history of nicotine dependence: Secondary | ICD-10-CM | POA: Insufficient documentation

## 2023-12-21 DIAGNOSIS — Z3A01 Less than 8 weeks gestation of pregnancy: Secondary | ICD-10-CM | POA: Diagnosis not present

## 2023-12-21 DIAGNOSIS — O210 Mild hyperemesis gravidarum: Secondary | ICD-10-CM | POA: Diagnosis present

## 2023-12-21 LAB — URINALYSIS, ROUTINE W REFLEX MICROSCOPIC
Bilirubin Urine: NEGATIVE
Glucose, UA: NEGATIVE mg/dL
Hgb urine dipstick: NEGATIVE
Ketones, ur: 5 mg/dL — AB
Leukocytes,Ua: NEGATIVE
Nitrite: NEGATIVE
Protein, ur: 30 mg/dL — AB
Specific Gravity, Urine: 1.025 (ref 1.005–1.030)
pH: 6 (ref 5.0–8.0)

## 2023-12-21 LAB — COMPREHENSIVE METABOLIC PANEL WITH GFR
ALT: 16 U/L (ref 0–44)
AST: 16 U/L (ref 15–41)
Albumin: 3.2 g/dL — ABNORMAL LOW (ref 3.5–5.0)
Alkaline Phosphatase: 56 U/L (ref 38–126)
Anion gap: 9 (ref 5–15)
BUN: 5 mg/dL — ABNORMAL LOW (ref 6–20)
CO2: 23 mmol/L (ref 22–32)
Calcium: 8.8 mg/dL — ABNORMAL LOW (ref 8.9–10.3)
Chloride: 105 mmol/L (ref 98–111)
Creatinine, Ser: 0.68 mg/dL (ref 0.44–1.00)
GFR, Estimated: >60 mL/min (ref >60)
Glucose, Bld: 91 mg/dL (ref 70–99)
Potassium: 4.2 mmol/L (ref 3.5–5.1)
Sodium: 137 mmol/L (ref 135–145)
Total Bilirubin: 0.4 mg/dL (ref 0.0–1.2)
Total Protein: 6.5 g/dL (ref 6.5–8.1)

## 2023-12-21 LAB — CBC
HCT: 35.9 % — ABNORMAL LOW (ref 36.0–46.0)
Hemoglobin: 11.3 g/dL — ABNORMAL LOW (ref 12.0–15.0)
MCH: 21.8 pg — ABNORMAL LOW (ref 26.0–34.0)
MCHC: 31.5 g/dL (ref 30.0–36.0)
MCV: 69.2 fL — ABNORMAL LOW (ref 80.0–100.0)
Platelets: 284 K/uL (ref 150–400)
RBC: 5.19 MIL/uL — ABNORMAL HIGH (ref 3.87–5.11)
RDW: 14.3 % (ref 11.5–15.5)
WBC: 8.5 K/uL (ref 4.0–10.5)
nRBC: 0 % (ref 0.0–0.2)

## 2023-12-21 MED ORDER — LACTATED RINGERS IV BOLUS
1000.0000 mL | Freq: Once | INTRAVENOUS | Status: AC
  Administered 2023-12-21: 1000 mL via INTRAVENOUS

## 2023-12-21 MED ORDER — SODIUM CHLORIDE 0.9 % IV SOLN
25.0000 mg | Freq: Once | INTRAVENOUS | Status: DC
  Filled 2023-12-21: qty 1

## 2023-12-21 MED ORDER — SCOPOLAMINE 1 MG/3DAYS TD PT72
1.0000 | MEDICATED_PATCH | Freq: Once | TRANSDERMAL | Status: DC
  Administered 2023-12-21: 1 mg via TRANSDERMAL
  Filled 2023-12-21: qty 1

## 2023-12-21 MED ORDER — ONDANSETRON 4 MG PO TBDP: 4.0000 mg | ORAL_TABLET | Freq: Three times a day (TID) | ORAL | 0 refills | Status: DC | PRN

## 2023-12-21 MED ORDER — FAMOTIDINE IN NACL 20-0.9 MG/50ML-% IV SOLN
20.0000 mg | Freq: Once | INTRAVENOUS | Status: AC
  Administered 2023-12-21: 20 mg via INTRAVENOUS
  Filled 2023-12-21: qty 50

## 2023-12-21 MED ORDER — SODIUM CHLORIDE 0.9 % IV SOLN
8.0000 mg | Freq: Once | INTRAVENOUS | Status: AC
  Administered 2023-12-21: 8 mg via INTRAVENOUS
  Filled 2023-12-21: qty 4

## 2023-12-21 NOTE — MAU Note (Signed)
 Beth Valdez is a 23 y.o. at [redacted]w[redacted]d here in MAU reporting: she's having lower abdominal cramping that began one week ago, denies VB.  Also states I think I'm dehydrated, I can't keep anything down.  Reports she's vomited 5x in the past 24 hours.  States was taking Diclegis but stopped.  Reports was given Reglan , last took Sunday.  LMP: 11/05/2023 Onset of complaint: last week Pain score: 7 Vitals:   12/21/23 0827  BP: 113/67  Pulse: 63  Resp: 20  Temp: 98.6 F (37 C)  SpO2: 100%     FHT: NA  Lab orders placed from triage: UA
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
## 2024-01-03 ENCOUNTER — Inpatient Hospital Stay (HOSPITAL_COMMUNITY)
Admission: AD | Admit: 2024-01-03 | Discharge: 2024-01-03 | Disposition: A | Discharge status: Home / Self Care | Payer: Self-pay | Attending: Obstetrics and Gynecology | Admitting: Obstetrics and Gynecology

## 2024-01-03 ENCOUNTER — Ambulatory Visit: Admitting: *Deleted

## 2024-01-03 ENCOUNTER — Other Ambulatory Visit: Payer: Self-pay

## 2024-01-03 ENCOUNTER — Other Ambulatory Visit (INDEPENDENT_AMBULATORY_CARE_PROVIDER_SITE_OTHER): Payer: Self-pay

## 2024-01-03 VITALS — BP 102/68 | HR 66 | Wt 204.0 lb

## 2024-01-03 DIAGNOSIS — O219 Vomiting of pregnancy, unspecified: Secondary | ICD-10-CM

## 2024-01-03 DIAGNOSIS — Z3A08 8 weeks gestation of pregnancy: Secondary | ICD-10-CM

## 2024-01-03 DIAGNOSIS — Z3480 Encounter for supervision of other normal pregnancy, unspecified trimester: Secondary | ICD-10-CM | POA: Insufficient documentation

## 2024-01-03 DIAGNOSIS — O3680X0 Pregnancy with inconclusive fetal viability, not applicable or unspecified: Secondary | ICD-10-CM

## 2024-01-03 DIAGNOSIS — Z8632 Personal history of gestational diabetes: Secondary | ICD-10-CM | POA: Insufficient documentation

## 2024-01-03 LAB — URINALYSIS, ROUTINE W REFLEX MICROSCOPIC
Bilirubin Urine: NEGATIVE
Glucose, UA: NEGATIVE mg/dL
Hgb urine dipstick: NEGATIVE
Ketones, ur: NEGATIVE mg/dL
Nitrite: NEGATIVE
Protein, ur: NEGATIVE mg/dL
Specific Gravity, Urine: <1.005 — ABNORMAL LOW (ref 1.005–1.030)
pH: 6.5 (ref 5.0–8.0)

## 2024-01-03 LAB — URINALYSIS, MICROSCOPIC (REFLEX)

## 2024-01-03 MED ORDER — LACTATED RINGERS IV BOLUS: 1000.0000 mL | Freq: Once | INTRAVENOUS | Status: DC

## 2024-01-03 MED ORDER — FAMOTIDINE 20 MG PO TABS
20.0000 mg | ORAL_TABLET | Freq: Once | ORAL | Status: AC
  Administered 2024-01-03: 20 mg via ORAL
  Filled 2024-01-03: qty 1

## 2024-01-03 MED ORDER — ONDANSETRON 4 MG PO TBDP
8.0000 mg | ORAL_TABLET | Freq: Once | ORAL | Status: AC
  Administered 2024-01-03: 8 mg via ORAL
  Filled 2024-01-03: qty 2

## 2024-01-03 MED ORDER — ONDANSETRON 4 MG PO TBDP: 4.0000 mg | ORAL_TABLET | Freq: Three times a day (TID) | ORAL | 2 refills | Status: DC | PRN

## 2024-01-03 MED ORDER — SCOPOLAMINE 1 MG/3DAYS TD PT72
1.0000 | MEDICATED_PATCH | TRANSDERMAL | Status: DC
  Administered 2024-01-03: 1 mg via TRANSDERMAL
  Filled 2024-01-03: qty 1

## 2024-01-03 MED ORDER — METOCLOPRAMIDE HCL 10 MG PO TABS: 10.0000 mg | ORAL_TABLET | Freq: Once | ORAL | Status: DC

## 2024-01-03 MED ORDER — FAMOTIDINE 20 MG PO TABS: 20.0000 mg | ORAL_TABLET | Freq: Every day | ORAL | 11 refills | Status: DC

## 2024-01-03 MED ORDER — BLOOD PRESSURE KIT DEVI: 1.0000 | 0 refills | Status: DC

## 2024-01-03 MED ORDER — SCOPOLAMINE 1 MG/3DAYS TD PT72: 1.0000 | MEDICATED_PATCH | TRANSDERMAL | 12 refills | Status: AC

## 2024-01-03 NOTE — MAU Note (Signed)
 Beth Valdez is a 23 y.o. at [redacted]w[redacted]d here in MAU reporting: she's having bad nausea but now noticing blood in vomit.  States has vomited twice in 24 hours.  Reports began noticing blood in vomit last night once and once again today.  Also reports burning in throat and stomach really bad Denies VB.  LMP: 11/05/2023 Onset of complaint: last night Pain score: 0 Vitals:   01/03/24 1107  BP: (!) 114/56  Pulse: 65  Resp: 18  Temp: 98.3 F (36.8 C)     FHT: NA  Lab orders placed from triage: UA

## 2024-01-03 NOTE — MAU Provider Note (Signed)
 MAU Note   S Ms. Beth Valdez is a 23 y.o. G9P1011 female at [redacted]w[redacted]d who presents to MAU today with complaint of blood in vomit. She reports having persistent daily vomiting after eating every day for the last 2 months. She describes now being hesitant to eat because of persistent vomiting. In the last day, she has noticed bright red blood in her vomit and describes her vomit as consistently looking like food that she previously ate. She also notes a burning sensation from the epigastrium to the throat that worsens with spicy food and movement that has started in the last few days. She has been taking zofran  about once a day for nausea, but typically takes it right at the onset of nausea PRN. She denies SOB, dizziness, presyncope, diarrhea. She has noted some constipation since 11/13. She denies recent travel or new foods.   Receives care at [facility]. Prenatal records reviewed.  Pertinent items noted in HPI and remainder of comprehensive ROS otherwise negative.   O BP (!) 114/56 (BP Location: Right Arm)   Pulse 65   Temp 98.3 F (36.8 C) (Oral)   Resp 18   Ht 5' 7 (1.702 m)   Wt 92.7 kg   LMP 11/05/2023 (Exact Date)   BMI 32.00 kg/m  Physical Exam Constitutional:      Appearance: Normal appearance.  Cardiovascular:     Rate and Rhythm: Normal rate and regular rhythm.  Pulmonary:     Effort: Pulmonary effort is normal.     Breath sounds: Normal breath sounds.  Abdominal:     General: Abdomen is flat. Bowel sounds are normal. There is no distension.     Palpations: Abdomen is soft.     Tenderness: There is no abdominal tenderness. There is no guarding.  Neurological:     Mental Status: She is alert.  Psychiatric:        Mood and Affect: Mood normal.        Behavior: Behavior normal.        Thought Content: Thought content normal.    Results for orders placed or performed during the hospital encounter of 01/03/24 (from the past 24 hours)  Urinalysis, Routine w reflex microscopic  -Urine, Clean Catch     Status: Abnormal   Collection Time: 01/03/24 11:20 AM  Result Value Ref Range   Color, Urine YELLOW YELLOW   APPearance CLEAR CLEAR   Specific Gravity, Urine <1.005 (L) 1.005 - 1.030   pH 6.5 5.0 - 8.0   Glucose, UA NEGATIVE NEGATIVE mg/dL   Hgb urine dipstick NEGATIVE NEGATIVE   Bilirubin Urine NEGATIVE NEGATIVE   Ketones, ur NEGATIVE NEGATIVE mg/dL   Protein, ur NEGATIVE NEGATIVE mg/dL   Nitrite NEGATIVE NEGATIVE   Leukocytes,Ua SMALL (A) NEGATIVE  Urinalysis, Microscopic (reflex)     Status: Abnormal   Collection Time: 01/03/24 11:20 AM  Result Value Ref Range   RBC / HPF 0-5 0 - 5 RBC/hpf   WBC, UA 0-5 0 - 5 WBC/hpf   Bacteria, UA RARE (A) NONE SEEN   Squamous Epithelial / HPF 0-5 0 - 5 /HPF   Mucus PRESENT     MDM: MAU Course:  Of note, scopolamine patch, famotidine , and Zofran  were started in MAU and patient noticed improvement in epigastric burning. Patient is amenable to starting scopolamine patch and famotidine  on a scheduled basis, with possible incorporation of scheduled Zofran  or Diclegis if needed.   A There are no diagnoses linked to this encounter. Medical screening exam  complete Based on patient history of new onset blood in vomit and epigastric burning radiating to the throat, this is likely related to persistent vomiting and irriation from stomach acid. Since patient is taking zofran  PRN specifically in instances of worsening nausea/vomiting, it is possible that this regime of rescue zofran  is not sufficient and possibly being vomited up since it is not scheduled.   P -start famotidine  20 mg every day -start scopolamine patch, can change once every 3 days -may use zofran  1-2 tablets q8h for breakthrough nausea -may use Reglan  q6h PO or vaginally for breakthrough nausea -Discussion of importance of eating small meals every 2-3 hours  Discharged from MAU in stable condition with information on when to follow up with OBGYN if medication  regimen not sufficient for management of nausea/vomiting Follow up at [facility] as scheduled for ongoing prenatal care  Future Appointments  Date Time Provider Department Center  01/03/2024  3:10 PM [facility] INTAKE [facility] None  01/17/2024  9:55 AM Delores Nidia CROME, FNP [facility] None    Brad Prey, Medical Student

## 2024-01-03 NOTE — Discharge Instructions (Signed)
 NAUSEA: For nausea PREVENTION: START famotidine  20 mg once daily. This will reduce the acid levels in your stomach to help with both heartburn and nausea. If you need to, you can take this medicine twice a day (morning and evening) to control heartburn symptoms. START scopolamine patch, change the patch once every 72 hours (3 days) If needed, you can continue the Diclegis (doxylamine pyridoxine) 1-2 times daily for nausea prevention. For BREAKTHROUGH NAUSEA not controlled by the above regimen: Use Zofran  1-2 tablets (4-8 mg) every 8 hours as needed. Use Reglan  (metoclopramide ) every 6 hours as needed. If you cannot keep anything down by mouth, you can place Reglan  intravaginally. We recommend eating within an hour of getting up, some people even eat a few crackers before getting out of bed. Then make sure you eat at least something small every 2-3 hours, focusing on protein and bland foods. Going too long between meals can make the nausea worse. Also, try not to drink a lot of fluids on an empty stomach if you feel nauseas, this may just make it worse! Peppermint essential oil: you can keep this with you to smell or put a little behind your ears when you're nauseas Ginger candy: I specifically like the [employer] Belly Comfort. You can search online for morning sickness drops/candy and find other things! You can also try Ginger Ale or ginger teas. Frozen fruits or popsicles

## 2024-01-03 NOTE — Progress Notes (Signed)
 New OB Intake  I connected with Verta Blush  on 01/03/24 at  3:10 PM EST by In Person Visit and verified that I am speaking with the correct person using two identifiers. Nurse is located at [facility] and pt is located at [place].  I discussed the limitations, risks, security and privacy concerns of performing an evaluation and management service by telephone and the availability of in person appointments. I also discussed with the patient that there may be a patient responsible charge related to this service. The patient expressed understanding and agreed to proceed.  I explained I am completing New OB Intake today. We discussed EDD of 08/11/24 based on LMP of 11/05/23. Pt is G4P1021. I reviewed her allergies, medications and Medical/Surgical/OB history.    Patient Active Problem List   Diagnosis Date Noted   History of gestational diabetes 01/03/2024   Supervision of other normal pregnancy, antepartum 01/03/2024   Prediabetes 04/01/2018   Supervision of normal first teen pregnancy 03/22/2018     Concerns addressed today  Delivery Plans Plans to deliver at [facility]. Discussed the nature of our practice with multiple providers including residents and students as well as female and female providers. Due to the size of the practice, the delivering provider may not be the same as those providing prenatal care.   Patient is not interested in water birth.  MyChart/Babyscripts MyChart access verified. I explained pt will have some visits in office and some virtually. Babyscripts instructions given and order placed. Patient verifies receipt of registration text/e-mail. Account successfully created and app downloaded. If patient is a candidate for Optimized scheduling, add to sticky note.   Blood Pressure Cuff/Weight Scale Blood pressure cuff ordered for patient to pick-up from [employer]. Explained after first prenatal appt pt will check weekly and document in Babyscripts. Patient does not  have weight scale; patient may purchase if they desire to track weight weekly in Babyscripts.  Anatomy US  Explained first scheduled US  will be around 19 weeks. Anatomy US  scheduled for TBD at TBD.  Is patient a candidate for Babyscripts Optimization? No, due to Risk Factors.   First visit review I reviewed new OB appt with patient. Explained pt will be seen by Nidia Daring, NP at first visit. Discussed [place] genetic screening with patient. Requests Panorama and Horizon.. Routine prenatal labs Pt unable to void. Initial prenatal labs deferred to New OB appt.   Last Pap No results found for: DIAGPAP  Rocky CHRISTELLA Ober, RN 01/03/2024  5:34 PM

## 2024-01-11 ENCOUNTER — Telehealth

## 2024-01-17 ENCOUNTER — Other Ambulatory Visit (HOSPITAL_COMMUNITY)
Admission: RE | Admit: 2024-01-17 | Discharge: 2024-01-17 | Disposition: A | Source: Ambulatory Visit | Attending: Obstetrics and Gynecology | Admitting: Obstetrics and Gynecology

## 2024-01-17 ENCOUNTER — Encounter: Payer: Self-pay | Admitting: Obstetrics and Gynecology

## 2024-01-17 ENCOUNTER — Ambulatory Visit: Admitting: Obstetrics and Gynecology

## 2024-01-17 VITALS — BP 105/66 | HR 79 | Wt 199.0 lb

## 2024-01-17 DIAGNOSIS — Z3A10 10 weeks gestation of pregnancy: Secondary | ICD-10-CM | POA: Diagnosis not present

## 2024-01-17 DIAGNOSIS — Z3480 Encounter for supervision of other normal pregnancy, unspecified trimester: Secondary | ICD-10-CM

## 2024-01-17 DIAGNOSIS — K5900 Constipation, unspecified: Secondary | ICD-10-CM

## 2024-01-17 DIAGNOSIS — Z8632 Personal history of gestational diabetes: Secondary | ICD-10-CM | POA: Diagnosis not present

## 2024-01-17 MED ORDER — ASPIRIN 81 MG PO TBEC: 81.0000 mg | DELAYED_RELEASE_TABLET | Freq: Every day | ORAL | 2 refills | Status: DC

## 2024-01-17 MED ORDER — POLYETHYLENE GLYCOL 3350 17 GM/SCOOP PO POWD: 17.0000 g | Freq: Every day | ORAL | 1 refills | Status: AC | PRN

## 2024-01-17 NOTE — Patient Instructions (Signed)
 MFM Ultrasound  : [phone] [address], [place] [address]

## 2024-01-17 NOTE — Progress Notes (Signed)
 Pt complains of constipation.  Pt aware and agreeable to counselor.   PHQ of 8 and GAD of 4; referral made  Pt has no other concerns at this time.
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
## 2024-01-18 ENCOUNTER — Encounter: Admitting: Certified Nurse Midwife

## 2024-01-18 ENCOUNTER — Ambulatory Visit: Payer: Self-pay | Admitting: Obstetrics and Gynecology

## 2024-01-18 DIAGNOSIS — O99810 Abnormal glucose complicating pregnancy: Secondary | ICD-10-CM | POA: Insufficient documentation

## 2024-01-18 DIAGNOSIS — Z3480 Encounter for supervision of other normal pregnancy, unspecified trimester: Secondary | ICD-10-CM

## 2024-01-18 LAB — CBC/D/PLT+RPR+RH+ABO+RUBIGG...
Antibody Screen: NEGATIVE
Basophils Absolute: 0 x10E3/uL (ref 0.0–0.2)
Basos: 0 %
EOS (ABSOLUTE): 0 x10E3/uL (ref 0.0–0.4)
Eos: 0 %
HCV Ab: NONREACTIVE
HIV Screen 4th Generation wRfx: NONREACTIVE
Hematocrit: 39.7 % (ref 34.0–46.6)
Hemoglobin: 12.3 g/dL (ref 11.1–15.9)
Hepatitis B Surface Ag: NEGATIVE
Immature Grans (Abs): 0 x10E3/uL (ref 0.0–0.1)
Immature Granulocytes: 0 %
Lymphocytes Absolute: 1.5 x10E3/uL (ref 0.7–3.1)
Lymphs: 17 %
MCH: 22 pg — ABNORMAL LOW (ref 26.6–33.0)
MCHC: 31 g/dL — ABNORMAL LOW (ref 31.5–35.7)
MCV: 71 fL — ABNORMAL LOW (ref 79–97)
Monocytes Absolute: 0.6 x10E3/uL (ref 0.1–0.9)
Monocytes: 6 %
Neutrophils Absolute: 6.9 x10E3/uL (ref 1.4–7.0)
Neutrophils: 77 %
Platelets: 306 x10E3/uL (ref 150–450)
RBC: 5.58 x10E6/uL — ABNORMAL HIGH (ref 3.77–5.28)
RDW: 16.3 % — ABNORMAL HIGH (ref 11.7–15.4)
RPR Ser Ql: NONREACTIVE
Rh Factor: POSITIVE
Rubella Antibodies, IGG: 4.97 {index} (ref >0.99)
WBC: 9.1 x10E3/uL (ref 3.4–10.8)

## 2024-01-18 LAB — CERVICOVAGINAL ANCILLARY ONLY
Chlamydia: NEGATIVE
Comment: NEGATIVE
Comment: NEGATIVE
Comment: NORMAL
Neisseria Gonorrhea: NEGATIVE
Trichomonas: NEGATIVE

## 2024-01-18 LAB — HEMOGLOBIN A1C
Est. average glucose Bld gHb Est-mCnc: 117 mg/dL
Hgb A1c MFr Bld: 5.7 % — ABNORMAL HIGH (ref 4.8–5.6)

## 2024-01-18 LAB — CYTOLOGY - PAP: Diagnosis: NEGATIVE

## 2024-01-18 LAB — HCV INTERPRETATION

## 2024-01-20 LAB — URINE CULTURE, OB REFLEX

## 2024-01-20 LAB — CULTURE, OB URINE

## 2024-01-22 LAB — PANORAMA PRENATAL TEST FULL PANEL:PANORAMA TEST PLUS 5 ADDITIONAL MICRODELETIONS: FETAL FRACTION: 3.4

## 2024-01-29 ENCOUNTER — Encounter (HOSPITAL_COMMUNITY): Payer: Self-pay | Admitting: Obstetrics & Gynecology

## 2024-01-29 ENCOUNTER — Inpatient Hospital Stay (HOSPITAL_COMMUNITY)
Admission: AD | Admit: 2024-01-29 | Discharge: 2024-01-29 | Disposition: A | Discharge status: Home / Self Care | Attending: Obstetrics & Gynecology | Admitting: Obstetrics & Gynecology

## 2024-01-29 DIAGNOSIS — O219 Vomiting of pregnancy, unspecified: Secondary | ICD-10-CM | POA: Diagnosis not present

## 2024-01-29 DIAGNOSIS — Z3A12 12 weeks gestation of pregnancy: Secondary | ICD-10-CM | POA: Diagnosis not present

## 2024-01-29 DIAGNOSIS — K5900 Constipation, unspecified: Secondary | ICD-10-CM | POA: Diagnosis not present

## 2024-01-29 DIAGNOSIS — O99611 Diseases of the digestive system complicating pregnancy, first trimester: Secondary | ICD-10-CM | POA: Diagnosis not present

## 2024-01-29 LAB — URINALYSIS, ROUTINE W REFLEX MICROSCOPIC
Bacteria, UA: NONE SEEN
Glucose, UA: NEGATIVE mg/dL
Ketones, ur: 80 mg/dL — AB
Nitrite: NEGATIVE
Protein, ur: 100 mg/dL — AB
Specific Gravity, Urine: 1.028 (ref 1.005–1.030)
pH: 5 (ref 5.0–8.0)

## 2024-01-29 LAB — BASIC METABOLIC PANEL WITH GFR
Anion gap: 13 (ref 5–15)
BUN: <5 mg/dL — ABNORMAL LOW (ref 6–20)
CO2: 17 mmol/L — ABNORMAL LOW (ref 22–32)
Calcium: 8.6 mg/dL — ABNORMAL LOW (ref 8.9–10.3)
Chloride: 103 mmol/L (ref 98–111)
Creatinine, Ser: 0.56 mg/dL (ref 0.44–1.00)
GFR, Estimated: >60 mL/min (ref >60)
Glucose, Bld: 81 mg/dL (ref 70–99)
Potassium: 3.8 mmol/L (ref 3.5–5.1)
Sodium: 133 mmol/L — ABNORMAL LOW (ref 135–145)

## 2024-01-29 MED ORDER — FAMOTIDINE IN NACL 20-0.9 MG/50ML-% IV SOLN
20.0000 mg | Freq: Once | INTRAVENOUS | Status: AC
  Administered 2024-01-29: 20 mg via INTRAVENOUS
  Filled 2024-01-29: qty 50

## 2024-01-29 MED ORDER — PROMETHAZINE HCL 25 MG PO TABS: 25.0000 mg | ORAL_TABLET | Freq: Four times a day (QID) | ORAL | 2 refills | Status: AC | PRN

## 2024-01-29 MED ORDER — SODIUM CHLORIDE 0.9 % IV SOLN
25.0000 mg | Freq: Once | INTRAVENOUS | Status: AC
  Administered 2024-01-29: 25 mg via INTRAVENOUS
  Filled 2024-01-29: qty 1

## 2024-01-29 MED ORDER — LACTATED RINGERS IV BOLUS
1000.0000 mL | Freq: Once | INTRAVENOUS | Status: AC
  Administered 2024-01-29: 1000 mL via INTRAVENOUS

## 2024-01-29 NOTE — MAU Note (Signed)
..  Beth Valdez is a 23 y.o. at [redacted]w[redacted]d here in MAU reporting: ongoing nausea and vomiting, approximately 2-3 episodes yesterday and 3 times this morning. Denies diarrhea. She was taking zofran  and pepcid  regularly until a few days ago but states it stopped working. She currently has a scopolamine  patch in place. Denies VB or LOF. No other concerns today.  Pain score: 0 Vitals:   01/29/24 1019  BP: 122/66  Pulse: (!) 106  Resp: 14  Temp: 98.6 F (37 C)  SpO2: 100%     FHT:164 Lab orders placed from triage:   UA
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
## 2024-02-04 ENCOUNTER — Inpatient Hospital Stay (HOSPITAL_COMMUNITY)

## 2024-02-04 ENCOUNTER — Other Ambulatory Visit: Payer: Self-pay

## 2024-02-04 ENCOUNTER — Encounter (HOSPITAL_COMMUNITY): Payer: Self-pay | Admitting: Obstetrics and Gynecology

## 2024-02-04 ENCOUNTER — Encounter: Payer: Self-pay | Admitting: Obstetrics and Gynecology

## 2024-02-04 ENCOUNTER — Inpatient Hospital Stay (HOSPITAL_COMMUNITY)
Admission: AD | Admit: 2024-02-04 | Discharge: 2024-02-09 | DRG: 832 | Disposition: A | Discharge status: Home / Self Care | Payer: Self-pay | Attending: Internal Medicine | Admitting: Internal Medicine

## 2024-02-04 DIAGNOSIS — E860 Dehydration: Secondary | ICD-10-CM | POA: Diagnosis present

## 2024-02-04 DIAGNOSIS — O26611 Liver and biliary tract disorders in pregnancy, first trimester: Secondary | ICD-10-CM | POA: Diagnosis present

## 2024-02-04 DIAGNOSIS — Z8249 Family history of ischemic heart disease and other diseases of the circulatory system: Secondary | ICD-10-CM

## 2024-02-04 DIAGNOSIS — K219 Gastro-esophageal reflux disease without esophagitis: Secondary | ICD-10-CM | POA: Diagnosis present

## 2024-02-04 DIAGNOSIS — Z7982 Long term (current) use of aspirin: Secondary | ICD-10-CM

## 2024-02-04 DIAGNOSIS — R112 Nausea with vomiting, unspecified: Secondary | ICD-10-CM | POA: Diagnosis present

## 2024-02-04 DIAGNOSIS — K828 Other specified diseases of gallbladder: Secondary | ICD-10-CM | POA: Diagnosis present

## 2024-02-04 DIAGNOSIS — Z3480 Encounter for supervision of other normal pregnancy, unspecified trimester: Principal | ICD-10-CM

## 2024-02-04 DIAGNOSIS — Z148 Genetic carrier of other disease: Secondary | ICD-10-CM | POA: Insufficient documentation

## 2024-02-04 DIAGNOSIS — D563 Thalassemia minor: Secondary | ICD-10-CM | POA: Insufficient documentation

## 2024-02-04 DIAGNOSIS — K5900 Constipation, unspecified: Secondary | ICD-10-CM | POA: Diagnosis present

## 2024-02-04 DIAGNOSIS — O99611 Diseases of the digestive system complicating pregnancy, first trimester: Secondary | ICD-10-CM | POA: Diagnosis present

## 2024-02-04 DIAGNOSIS — O211 Hyperemesis gravidarum with metabolic disturbance: Principal | ICD-10-CM | POA: Diagnosis present

## 2024-02-04 DIAGNOSIS — R7989 Other specified abnormal findings of blood chemistry: Secondary | ICD-10-CM | POA: Diagnosis present

## 2024-02-04 DIAGNOSIS — O99281 Endocrine, nutritional and metabolic diseases complicating pregnancy, first trimester: Secondary | ICD-10-CM | POA: Diagnosis present

## 2024-02-04 DIAGNOSIS — E876 Hypokalemia: Secondary | ICD-10-CM | POA: Diagnosis present

## 2024-02-04 DIAGNOSIS — Z8632 Personal history of gestational diabetes: Secondary | ICD-10-CM

## 2024-02-04 DIAGNOSIS — Z3A13 13 weeks gestation of pregnancy: Secondary | ICD-10-CM

## 2024-02-04 LAB — CBC
HCT: 42.8 % (ref 36.0–46.0)
Hemoglobin: 14.1 g/dL (ref 12.0–15.0)
MCH: 22.1 pg — ABNORMAL LOW (ref 26.0–34.0)
MCHC: 32.9 g/dL (ref 30.0–36.0)
MCV: 67.1 fL — ABNORMAL LOW (ref 80.0–100.0)
Platelets: 265 K/uL (ref 150–400)
RBC: 6.38 MIL/uL — ABNORMAL HIGH (ref 3.87–5.11)
RDW: 17.6 % — ABNORMAL HIGH (ref 11.5–15.5)
WBC: 11.3 K/uL — ABNORMAL HIGH (ref 4.0–10.5)
nRBC: 0 % (ref 0.0–0.2)

## 2024-02-04 LAB — COMPREHENSIVE METABOLIC PANEL WITH GFR
ALT: 207 U/L — ABNORMAL HIGH (ref 0–44)
AST: 108 U/L — ABNORMAL HIGH (ref 15–41)
Albumin: 4.5 g/dL (ref 3.5–5.0)
Alkaline Phosphatase: 123 U/L (ref 38–126)
Anion gap: 19 — ABNORMAL HIGH (ref 5–15)
BUN: 5 mg/dL — ABNORMAL LOW (ref 6–20)
CO2: 19 mmol/L — ABNORMAL LOW (ref 22–32)
Calcium: 10.8 mg/dL — ABNORMAL HIGH (ref 8.9–10.3)
Chloride: 98 mmol/L (ref 98–111)
Creatinine, Ser: 0.63 mg/dL (ref 0.44–1.00)
GFR, Estimated: >60 mL/min
Glucose, Bld: 95 mg/dL (ref 70–99)
Potassium: 4.2 mmol/L (ref 3.5–5.1)
Sodium: 136 mmol/L (ref 135–145)
Total Bilirubin: 3 mg/dL — ABNORMAL HIGH (ref 0.0–1.2)
Total Protein: 8.4 g/dL — ABNORMAL HIGH (ref 6.5–8.1)

## 2024-02-04 LAB — URINALYSIS, ROUTINE W REFLEX MICROSCOPIC
Glucose, UA: 100 mg/dL — AB
Ketones, ur: >80 mg/dL — AB
Nitrite: NEGATIVE
Protein, ur: 100 mg/dL — AB
Specific Gravity, Urine: >1.03 — ABNORMAL HIGH (ref 1.005–1.030)
pH: 6 (ref 5.0–8.0)

## 2024-02-04 LAB — AMYLASE: Amylase: 133 U/L — ABNORMAL HIGH (ref 28–100)

## 2024-02-04 LAB — URINALYSIS, MICROSCOPIC (REFLEX): Bacteria, UA: NONE SEEN

## 2024-02-04 LAB — HORIZON CUSTOM: REPORT SUMMARY: POSITIVE — AB

## 2024-02-04 LAB — LIPASE, BLOOD: Lipase: 120 U/L — ABNORMAL HIGH (ref 11–51)

## 2024-02-04 MED ORDER — MORPHINE SULFATE (PF) 4 MG/ML IV SOLN: 1.0000 mg | INTRAVENOUS | Status: DC | PRN

## 2024-02-04 MED ORDER — FAMOTIDINE IN NACL 20-0.9 MG/50ML-% IV SOLN
20.0000 mg | Freq: Once | INTRAVENOUS | Status: AC
  Administered 2024-02-04: 20 mg via INTRAVENOUS
  Filled 2024-02-04: qty 50

## 2024-02-04 MED ORDER — FAMOTIDINE IN NACL 20-0.9 MG/50ML-% IV SOLN
20.0000 mg | Freq: Two times a day (BID) | INTRAVENOUS | Status: DC
  Administered 2024-02-05 – 2024-02-08 (×8): 20 mg via INTRAVENOUS
  Filled 2024-02-04 (×8): qty 50

## 2024-02-04 MED ORDER — SODIUM CHLORIDE 0.9 % IV SOLN
8.0000 mg | Freq: Once | INTRAVENOUS | Status: AC
  Administered 2024-02-04: 8 mg via INTRAVENOUS
  Filled 2024-02-04: qty 4

## 2024-02-04 MED ORDER — ONDANSETRON HCL 4 MG PO TABS
4.0000 mg | ORAL_TABLET | Freq: Four times a day (QID) | ORAL | Status: DC | PRN
  Filled 2024-02-04: qty 1

## 2024-02-04 MED ORDER — LACTATED RINGERS IV SOLN: INTRAVENOUS | Status: DC

## 2024-02-04 MED ORDER — LACTATED RINGERS IV BOLUS
1000.0000 mL | Freq: Once | INTRAVENOUS | Status: AC
  Administered 2024-02-04: 1000 mL via INTRAVENOUS

## 2024-02-04 MED ORDER — SCOPOLAMINE 1 MG/3DAYS TD PT72: 1.0000 | MEDICATED_PATCH | Freq: Once | TRANSDERMAL | Status: DC

## 2024-02-04 MED ORDER — ONDANSETRON HCL 4 MG/2ML IJ SOLN
4.0000 mg | Freq: Four times a day (QID) | INTRAMUSCULAR | Status: DC | PRN
  Administered 2024-02-06 – 2024-02-07 (×5): 4 mg via INTRAVENOUS
  Filled 2024-02-04 (×5): qty 2

## 2024-02-04 MED ORDER — SODIUM CHLORIDE 0.9 % IV SOLN
25.0000 mg | Freq: Once | INTRAVENOUS | Status: AC
  Administered 2024-02-04: 25 mg via INTRAVENOUS
  Filled 2024-02-04: qty 1

## 2024-02-04 NOTE — MAU Note (Signed)
 Beth Valdez is a 23 y.o. at [redacted]w[redacted]d here in MAU reporting: she's really out of breath and having N/V.  States the meds aren't working, taking Pepcid  and Phenergan .  States she's vomited 4-5 times today, unable to keep anything down.  Denies VB and pain, states stomach is burning  LMP: 11/05/2023 Onset of complaint: Saturday Pain score: 0 Vitals:   02/04/24 1438  BP: 110/62  Pulse: (!) 102  Resp: 20  Temp: 97.6 F (36.4 C)  SpO2: 98%     FHT: 158 bpm  Lab orders placed from triage: UA

## 2024-02-04 NOTE — MAU Note (Signed)
 Pt reports that she was able to keep down crackers and water.

## 2024-02-04 NOTE — Clinical Note (Incomplete)
 Beth Valdez

## 2024-02-04 NOTE — Hospital Course (Addendum)
 Beth Valdez is a 23 y.o. female  who is [redacted] weeks pregnant and is admitted with intractable nausea and vomiting and question of early/mild pancreatitis.Reports that she has been having persistent nausea and vomiting for the last 4 weeks and worse over the last couple of days with green/brown emesis.  No bloody emesis.  Also reported significant burning heartburn in the upper abdomen. Labs showed WBCs 11.3, AST 108, ALT 207, alk phos 123, total bilirubin 3.0, lipase 120, amylase 133 RUQ US  showed distended gallbladder with biliary sludge, no cholelithiasis Patient seen by OB/GYN GI and general surgery. MRCP abdomen showed no acute cholecystitis, choledocholithiasis or evidence of acute pancreatitis  Seen by GI OB/GYN surgery. Treated for hyperemesis gravidarum with improvement in the symptoms ambulating well tolerating diet and eager to go home today  Subjective: Seen and examined today Wants to go home today.  Tolerating diet Overnight on room air afebrile, VSS, Labs mild kalemia LFTs slightly downtrending AST CBC with stable anemia   Discharge Diagnoses:   Intractable nausea and vomiting, transaminitis, acute pancreatitis vs hyperemesis gravidarum: Extensive evaluation done for intractable nausea and vomiting with transaminitis, elevated lipase RUQ US  showed distended gallbladder with biliary sludge, no cholelithiasis. MRCP abdomen showed no acute cholecystitis, choledocholithiasis or evidence of acute pancreatitis, hepatitis panel negative, at this time suspecting hyperemesis gravidarum as the presentation. GI signed off. Surgery  s/o - per Dr Dasie has gallbladder sludge but other than that no other symptoms consistent with biliary colic,not recommending cholecystectomy at this time. Seen by OB agree with current recommendation diet ADHD, transition to IV antibiotics to p.o. as tolerated diet, replaced electrolytes   Dehydration mild  hypercalcemia Hypokalemia-severe Hypomagnesemia: Electrolytes overall improving replaced potassium   [redacted] weeks gestation of pregnancy Continue to follow-up with OB/GYN, had first prenatal visit on 12/1.  Evaluated by Dr. Zina on 12/20 OB following   GERD Continue Pepcid   DVT prophylaxis: SCDs Start: 02/04/24 2243 Code Status:   Code Status: Full Code Family Communication: plan of care discussed with patient at bedside. Patient status is: Remains hospitalized because of severity of illness Level of care: Med-Surg   Dispo: The patient is from: home            Anticipated disposition: home Objective: Vitals last 24 hrs: Vitals:   02/08/24 1039 02/08/24 1753 02/08/24 2227 02/09/24 0455  BP: (!) 103/52 (!) 113/52 (!) 114/59 (!) 112/56  Pulse: 78 79 83 72  Resp: 18 18 16 15   Temp: 98.6 F (37 C) 98.2 F (36.8 C) 98 F (36.7 C) 98 F (36.7 C)  TempSrc: Oral  Oral Oral  SpO2: 100% 100% 100% 100%  Weight:      Height:        Physical Examination: General exam: alert awake, oriented, older than stated age HEENT:Oral mucosa moist, Ear/Nose WNL grossly Respiratory system: Bilaterally clear BS,no use of accessory muscle Cardiovascular system: S1 & S2 +, No JVD. Gastrointestinal system: Abdomen soft,NT,ND, BS+ Nervous System: Alert, awake, moving all extremities,and following commands. Extremities: extremities warm, leg edema neg Skin: Warm, no rashes MSK: Normal muscle bulk,tone, power
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
## 2024-02-05 ENCOUNTER — Observation Stay (HOSPITAL_COMMUNITY)

## 2024-02-05 DIAGNOSIS — O26611 Liver and biliary tract disorders in pregnancy, first trimester: Secondary | ICD-10-CM | POA: Diagnosis present

## 2024-02-05 DIAGNOSIS — K828 Other specified diseases of gallbladder: Secondary | ICD-10-CM | POA: Diagnosis present

## 2024-02-05 DIAGNOSIS — Z8632 Personal history of gestational diabetes: Secondary | ICD-10-CM | POA: Diagnosis not present

## 2024-02-05 DIAGNOSIS — O99281 Endocrine, nutritional and metabolic diseases complicating pregnancy, first trimester: Secondary | ICD-10-CM | POA: Diagnosis present

## 2024-02-05 DIAGNOSIS — O210 Mild hyperemesis gravidarum: Secondary | ICD-10-CM | POA: Diagnosis present

## 2024-02-05 DIAGNOSIS — Z3A13 13 weeks gestation of pregnancy: Secondary | ICD-10-CM | POA: Diagnosis not present

## 2024-02-05 DIAGNOSIS — O211 Hyperemesis gravidarum with metabolic disturbance: Secondary | ICD-10-CM | POA: Diagnosis present

## 2024-02-05 DIAGNOSIS — O99611 Diseases of the digestive system complicating pregnancy, first trimester: Secondary | ICD-10-CM | POA: Diagnosis present

## 2024-02-05 DIAGNOSIS — R7989 Other specified abnormal findings of blood chemistry: Secondary | ICD-10-CM | POA: Diagnosis not present

## 2024-02-05 DIAGNOSIS — K5900 Constipation, unspecified: Secondary | ICD-10-CM | POA: Diagnosis present

## 2024-02-05 DIAGNOSIS — R112 Nausea with vomiting, unspecified: Secondary | ICD-10-CM | POA: Diagnosis not present

## 2024-02-05 DIAGNOSIS — K219 Gastro-esophageal reflux disease without esophagitis: Secondary | ICD-10-CM | POA: Diagnosis present

## 2024-02-05 DIAGNOSIS — Z7982 Long term (current) use of aspirin: Secondary | ICD-10-CM | POA: Diagnosis not present

## 2024-02-05 DIAGNOSIS — Z8249 Family history of ischemic heart disease and other diseases of the circulatory system: Secondary | ICD-10-CM | POA: Diagnosis not present

## 2024-02-05 DIAGNOSIS — E860 Dehydration: Secondary | ICD-10-CM | POA: Diagnosis present

## 2024-02-05 DIAGNOSIS — Z3480 Encounter for supervision of other normal pregnancy, unspecified trimester: Secondary | ICD-10-CM | POA: Diagnosis not present

## 2024-02-05 LAB — LIPASE, BLOOD: Lipase: 194 U/L — ABNORMAL HIGH (ref 11–51)

## 2024-02-05 LAB — COMPREHENSIVE METABOLIC PANEL WITH GFR
ALT: 183 U/L — ABNORMAL HIGH (ref 0–44)
AST: 101 U/L — ABNORMAL HIGH (ref 15–41)
Albumin: 3 g/dL — ABNORMAL LOW (ref 3.5–5.0)
Alkaline Phosphatase: 79 U/L (ref 38–126)
Anion gap: 15 (ref 5–15)
BUN: <5 mg/dL — ABNORMAL LOW (ref 6–20)
CO2: 14 mmol/L — ABNORMAL LOW (ref 22–32)
Calcium: 8.6 mg/dL — ABNORMAL LOW (ref 8.9–10.3)
Chloride: 106 mmol/L (ref 98–111)
Creatinine, Ser: 0.39 mg/dL — ABNORMAL LOW (ref 0.44–1.00)
GFR, Estimated: >60 mL/min
Glucose, Bld: 86 mg/dL (ref 70–99)
Potassium: 3.2 mmol/L — ABNORMAL LOW (ref 3.5–5.1)
Sodium: 135 mmol/L (ref 135–145)
Total Bilirubin: 2.1 mg/dL — ABNORMAL HIGH (ref 0.0–1.2)
Total Protein: 5.6 g/dL — ABNORMAL LOW (ref 6.5–8.1)

## 2024-02-05 LAB — CBC
HCT: 31.4 % — ABNORMAL LOW (ref 36.0–46.0)
Hemoglobin: 10.5 g/dL — ABNORMAL LOW (ref 12.0–15.0)
MCH: 22.2 pg — ABNORMAL LOW (ref 26.0–34.0)
MCHC: 33.4 g/dL (ref 30.0–36.0)
MCV: 66.5 fL — ABNORMAL LOW (ref 80.0–100.0)
Platelets: 207 K/uL (ref 150–400)
RBC: 4.72 MIL/uL (ref 3.87–5.11)
RDW: 16.3 % — ABNORMAL HIGH (ref 11.5–15.5)
WBC: 8.1 K/uL (ref 4.0–10.5)
nRBC: 0 % (ref 0.0–0.2)

## 2024-02-05 LAB — HIV ANTIBODY (ROUTINE TESTING W REFLEX): HIV Screen 4th Generation wRfx: NONREACTIVE

## 2024-02-05 MED ORDER — POTASSIUM CHLORIDE CRYS ER 20 MEQ PO TBCR
40.0000 meq | EXTENDED_RELEASE_TABLET | Freq: Once | ORAL | Status: AC
  Administered 2024-02-05: 40 meq via ORAL
  Filled 2024-02-05: qty 2

## 2024-02-05 MED ORDER — LACTATED RINGERS IV SOLN: INTRAVENOUS | Status: DC

## 2024-02-05 MED ORDER — PANTOPRAZOLE SODIUM 40 MG IV SOLR
40.0000 mg | Freq: Every day | INTRAVENOUS | Status: DC
  Administered 2024-02-05 – 2024-02-08 (×4): 40 mg via INTRAVENOUS
  Filled 2024-02-05 (×4): qty 10
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
## 2024-02-05 NOTE — Progress Notes (Signed)
 "          [facility]                                                                              Betsie Peckman, is a 23 y.o. female, DOB - 12/13/2000, FMW:[id number] Admit date - 02/04/2024    Outpatient Primary MD for the patient is [facility]  LOS - 0  days  Chief Complaint  Patient presents with   Shortness of Breath   Nausea   Emesis       Brief summary   Patient is a 23 year old female, [redacted] weeks pregnant and presented with intractable nausea and vomiting.  Reports that she has been having persistent nausea and vomiting for the last 4 weeks and worse over the last couple of days with green/brown emesis.  No bloody emesis.  Also reported significant burning heartburn in the upper abdomen. Labs showed WBCs 11.3, AST 108, ALT 207, alk phos 123, total bilirubin 3.0, lipase 120, amylase 133 RUQ US  showed distended gallbladder with biliary sludge, no cholelithiasis. Patient was admitted to medicine service for likely pancreatitis   Assessment & Plan       Intractable nausea and vomiting, transaminitis, acute pancreatitis -Presented with intractable nausea and vomiting with transaminitis, lipase elevated at 120 - Placed on n.p.o. status, IV fluids, antiemetics, lipase still trending up 194, LFTs still up - Discussed with GI, Dr. Rollin, could be still gallstone pancreatitis - Will obtain MRCP, requested general surgery consult  Dehydration, mild hypercalcemia -  n.p.o., continue gentle hydration    [redacted] weeks gestation of pregnancy - Continue to follow-up with OB/GYN, had first prenatal visit on 12/1 - Requested OB consult to follow   GERD - placed on Protonix    Estimated body mass index is 28.57 kg/m as calculated from the following:   Height as of this encounter: 5' 7 (1.702 m).   Weight as of this encounter: 82.7 kg.  Code Status: Full code DVT Prophylaxis:  SCDs Start: 02/04/24 2243   Level of Care: Level of care:  Med-Surg Family Communication: Updated patient Disposition Plan:      Remains inpatient appropriate:      Procedures:    Consultants:   Gastroenterology General Surgery OB  Antimicrobials:   Anti-infectives (From admission, onward)    None          Medications  potassium chloride   40 mEq Oral Once   scopolamine   1 patch Transdermal Once      Subjective:   Beth Valdez was seen and examined today.  Still nauseous Patient denies dizziness, chest pain, shortness of breath, abdominal pain, N/V/D/C, new weakness, numbess, tingling. No acute events overnight.    Objective:   Vitals:   02/04/24 1431 02/04/24 1438 02/05/24 0032 02/05/24 0406  BP:  110/62 (!) 107/55 (!) 106/56  Pulse:  (!) 102 84 82  Resp:  20 17 18   Temp:  97.6 F (36.4 C) 99.1 F (37.3 C) 98.3 F (36.8 C)  TempSrc:  Oral Oral Oral  SpO2:  98% 100% 100%  Weight: 82.7 kg     Height: 5' 7 (1.702 m)  Intake/Output Summary (Last 24 hours) at 02/05/2024 1032 Last data filed at 02/05/2024 0600 Gross per 24 hour  Intake 709.92 ml  Output --  Net 709.92 ml     Wt Readings from Last 3 Encounters:  02/04/24 82.7 kg  01/17/24 90.3 kg  01/03/24 92.5 kg     Exam General: Alert and oriented x 3, NAD Cardiovascular: S1 S2 auscultated,  RRR Respiratory: CTAB, no wheezing Gastrointestinal: Soft, nontender, nondistended, + bowel sounds Ext: no pedal edema bilaterally Neuro: no new deficits Psych: Normal affect     Data Reviewed:  I have personally reviewed following labs    CBC Lab Results  Component Value Date   WBC 8.1 02/05/2024   RBC 4.72 02/05/2024   HGB 10.5 (L) 02/05/2024   HCT 31.4 (L) 02/05/2024   MCV 66.5 (L) 02/05/2024   MCH 22.2 (L) 02/05/2024   PLT 207 02/05/2024   MCHC 33.4 02/05/2024   RDW 16.3 (H) 02/05/2024   LYMPHSABS 1.5 01/17/2024   MONOABS 0.7 07/18/2022   EOSABS 0.0 01/17/2024   BASOSABS 0.0 01/17/2024     Last metabolic panel Lab Results   Component Value Date   NA 135 02/05/2024   K 3.2 (L) 02/05/2024   CL 106 02/05/2024   CO2 14 (L) 02/05/2024   BUN <5 (L) 02/05/2024   CREATININE 0.39 (L) 02/05/2024   GLUCOSE 86 02/05/2024   GFRNONAA >60 02/05/2024   GFRAA >60 03/21/2019   CALCIUM 8.6 (L) 02/05/2024   PROT 5.6 (L) 02/05/2024   ALBUMIN 3.0 (L) 02/05/2024   LABGLOB 2.5 05/12/2018   AGRATIO 1.5 05/12/2018   BILITOT 2.1 (H) 02/05/2024   ALKPHOS 79 02/05/2024   AST 101 (H) 02/05/2024   ALT 183 (H) 02/05/2024   ANIONGAP 15 02/05/2024    CBG (last 3)  No results for input(s): GLUCAP in the last 72 hours.    Coagulation Profile: No results for input(s): INR, PROTIME in the last 168 hours.   Radiology Studies: I have personally reviewed the imaging studies  US  Abdomen Limited RUQ (LIVER/GB) Result Date: 02/04/2024 EXAM: Right Upper Quadrant Abdominal Ultrasound 02/04/2024 05:41:32 PM TECHNIQUE: Real-time ultrasonography of the right upper quadrant of the abdomen was performed. COMPARISON: 07/18/2022 CLINICAL HISTORY: Nausea \\T \ vomiting; Elevated liver enzymes. FINDINGS: LIVER: Normal echogenicity. No intrahepatic biliary ductal dilatation. No evidence of mass. Hepatopetal flow in the portal vein. BILIARY SYSTEM: The gallbladder is distended and full of biliary sludge. No shadowing gallstones visualized. No pericholecystic fluid or wall thickening. The common bile duct measures 4 mm. RIGHT KIDNEY: No hydronephrosis. No echogenic calculi. No mass. OTHER: No right upper quadrant ascites. IMPRESSION: 1. Distended gallbladder filled with biliary sludge. No shadowing cholecystolithiasis or changes of acute cholecystitis. Electronically signed by: Rogelia Myers MD 02/04/2024 06:23 PM EST RP Workstation: CARREN Nydia Distance M.D. [facility] 02/05/2024, 10:32 AM  Available via Epic secure chat 7am-7pm After 7 pm, please refer to night coverage provider listed on amion.    "

## 2024-02-05 NOTE — Plan of Care (Signed)
  Problem: Education: Goal: Knowledge of General Education information will improve Description: Including pain rating scale, medication(s)/side effects and non-pharmacologic comfort measures Outcome: Progressing   Problem: Clinical Measurements: Goal: Ability to maintain clinical measurements within normal limits will improve Outcome: Progressing Goal: Will remain free from infection Outcome: Progressing Goal: Diagnostic test results will improve Outcome: Progressing   Problem: Nutrition: Goal: Adequate nutrition will be maintained Outcome: Progressing   Problem: Safety: Goal: Ability to remain free from injury will improve Outcome: Progressing

## 2024-02-05 NOTE — Plan of Care (Signed)
  Problem: Education: Goal: Knowledge of General Education information will improve Description: Including pain rating scale, medication(s)/side effects and non-pharmacologic comfort measures Outcome: Progressing   Problem: Health Behavior/Discharge Planning: Goal: Ability to manage health-related needs will improve Outcome: Progressing   Problem: Activity: Goal: Risk for activity intolerance will decrease Outcome: Progressing   Problem: Elimination: Goal: Will not experience complications related to urinary retention Outcome: Progressing   Problem: Pain Managment: Goal: General experience of comfort will improve and/or be controlled Outcome: Progressing   Problem: Safety: Goal: Ability to remain free from injury will improve Outcome: Progressing

## 2024-02-06 ENCOUNTER — Inpatient Hospital Stay (HOSPITAL_COMMUNITY)

## 2024-02-06 DIAGNOSIS — R112 Nausea with vomiting, unspecified: Secondary | ICD-10-CM | POA: Diagnosis not present

## 2024-02-06 DIAGNOSIS — Z3480 Encounter for supervision of other normal pregnancy, unspecified trimester: Secondary | ICD-10-CM | POA: Diagnosis not present

## 2024-02-06 DIAGNOSIS — Z3A13 13 weeks gestation of pregnancy: Secondary | ICD-10-CM | POA: Diagnosis not present

## 2024-02-06 DIAGNOSIS — R7989 Other specified abnormal findings of blood chemistry: Secondary | ICD-10-CM | POA: Diagnosis not present

## 2024-02-06 LAB — COMPREHENSIVE METABOLIC PANEL WITH GFR
ALT: 201 U/L — ABNORMAL HIGH (ref 0–44)
AST: 106 U/L — ABNORMAL HIGH (ref 15–41)
Albumin: 3 g/dL — ABNORMAL LOW (ref 3.5–5.0)
Alkaline Phosphatase: 77 U/L (ref 38–126)
Anion gap: 14 (ref 5–15)
BUN: <5 mg/dL — ABNORMAL LOW (ref 6–20)
CO2: 17 mmol/L — ABNORMAL LOW (ref 22–32)
Calcium: 8.5 mg/dL — ABNORMAL LOW (ref 8.9–10.3)
Chloride: 104 mmol/L (ref 98–111)
Creatinine, Ser: 0.42 mg/dL — ABNORMAL LOW (ref 0.44–1.00)
GFR, Estimated: >60 mL/min
Glucose, Bld: 74 mg/dL (ref 70–99)
Potassium: 3.5 mmol/L (ref 3.5–5.1)
Sodium: 134 mmol/L — ABNORMAL LOW (ref 135–145)
Total Bilirubin: 1.8 mg/dL — ABNORMAL HIGH (ref 0.0–1.2)
Total Protein: 5.3 g/dL — ABNORMAL LOW (ref 6.5–8.1)

## 2024-02-06 LAB — HEPATITIS PANEL, ACUTE
HCV Ab: NONREACTIVE
Hep A IgM: NONREACTIVE
Hep B C IgM: NONREACTIVE
Hepatitis B Surface Ag: NONREACTIVE

## 2024-02-06 LAB — CBC
HCT: 29 % — ABNORMAL LOW (ref 36.0–46.0)
Hemoglobin: 9.7 g/dL — ABNORMAL LOW (ref 12.0–15.0)
MCH: 22.2 pg — ABNORMAL LOW (ref 26.0–34.0)
MCHC: 33.4 g/dL (ref 30.0–36.0)
MCV: 66.4 fL — ABNORMAL LOW (ref 80.0–100.0)
Platelets: 190 K/uL (ref 150–400)
RBC: 4.37 MIL/uL (ref 3.87–5.11)
RDW: 16.6 % — ABNORMAL HIGH (ref 11.5–15.5)
WBC: 7.9 K/uL (ref 4.0–10.5)
nRBC: 0 % (ref 0.0–0.2)

## 2024-02-06 LAB — LIPASE, BLOOD: Lipase: 222 U/L — ABNORMAL HIGH (ref 11–51)

## 2024-02-06 MED ORDER — LACTATED RINGERS IV SOLN: INTRAVENOUS | Status: DC

## 2024-02-06 NOTE — Progress Notes (Addendum)
 "          [facility]                                                                              Beth Valdez, is a 23 y.o. female, DOB - 10/30/2000, FMW:[id number] Admit date - 02/04/2024    Outpatient Primary MD for the patient is [facility]  LOS - 1  days  Chief Complaint  Patient presents with   Shortness of Breath   Nausea   Emesis       Brief summary   Patient is a 23 year old female, [redacted] weeks pregnant and presented with intractable nausea and vomiting.  Reports that she has been having persistent nausea and vomiting for the last 4 weeks and worse over the last couple of days with green/brown emesis.  No bloody emesis.  Also reported significant burning heartburn in the upper abdomen. Labs showed WBCs 11.3, AST 108, ALT 207, alk phos 123, total bilirubin 3.0, lipase 120, amylase 133 RUQ US  showed distended gallbladder with biliary sludge, no cholelithiasis. Patient was admitted to medicine service for likely pancreatitis   Assessment & Plan       Intractable nausea and vomiting, transaminitis, acute pancreatitis vs hyperemesis gravidarum -Presented with intractable nausea and vomiting with transaminitis, elevated lipase -RUQ US  showed distended gallbladder with biliary sludge, no cholelithiasis. - MRCP abdomen showed no acute cholecystitis, choledocholithiasis or evidence of acute pancreatitis. -Lipase still trending up 222, still elevated LFTs -Reviewed GI recommendations.   - General surgery following, seen by Dr. Dasie this morning, do not feel patient needs gallbladder removal at this time - Hyperemesis gravidarum commonly causes elevated liver enzymes due to severe dehydration/malnutrition but typically not elevated lipase. - Will check hepatitis panel, - feels a lot better today, will place on clear liquid diet.   Dehydration, mild hypercalcemia - Start clear liquid diet, continue gentle hydration    [redacted] weeks gestation of  pregnancy - Continue to follow-up with OB/GYN, had first prenatal visit on 12/1 - OB GYN following.   GERD - placed on Protonix    Estimated body mass index is 28.57 kg/m as calculated from the following:   Height as of this encounter: 5' 7 (1.702 m).   Weight as of this encounter: 82.7 kg.  Code Status: Full code DVT Prophylaxis:  SCDs Start: 02/04/24 2243   Level of Care: Level of care: Med-Surg Family Communication: Updated patient's mother on the phone today Disposition Plan:      Remains inpatient appropriate:   Hopefully within next 24 to 48 hours if continues to improve and tolerating solid diet   Procedures:    Consultants:   Gastroenterology General Surgery OB  Antimicrobials:   Anti-infectives (From admission, onward)    None          Medications  pantoprazole  (PROTONIX ) IV  40 mg Intravenous Daily   scopolamine   1 patch Transdermal Once      Subjective:   Beth Valdez was seen and examined today.  Nausea, vomiting improved, no significant abdominal pain.  Mother on the speaker phone.  No fever chills, abdominal pain.  Objective:   Vitals:   02/05/24 1844  02/05/24 2129 02/06/24 0537 02/06/24 1005  BP: 110/61 (!) 112/51 (!) 102/53 (!) 105/59  Pulse: 80 82 76 89  Resp: 17 17 17 15   Temp: 98.2 F (36.8 C) 97.7 F (36.5 C) 97.7 F (36.5 C) 98.1 F (36.7 C)  TempSrc: Oral Oral Oral Oral  SpO2: 100% 100% 100% 100%  Weight:      Height:        Intake/Output Summary (Last 24 hours) at 02/06/2024 1052 Last data filed at 02/06/2024 0900 Gross per 24 hour  Intake 1912.24 ml  Output --  Net 1912.24 ml     Wt Readings from Last 3 Encounters:  02/04/24 82.7 kg  01/17/24 90.3 kg  01/03/24 92.5 kg   Physical Exam General: Alert and oriented x 3, NAD Cardiovascular: S1 S2 clear, RRR.  Respiratory: CTAB Gastrointestinal: Soft, nontender, nondistended, NBS Ext: no pedal edema bilaterally Neuro: no new deficits Psych: Normal affect     Data Reviewed:  I have personally reviewed following labs    CBC Lab Results  Component Value Date   WBC 7.9 02/06/2024   RBC 4.37 02/06/2024   HGB 9.7 (L) 02/06/2024   HCT 29.0 (L) 02/06/2024   MCV 66.4 (L) 02/06/2024   MCH 22.2 (L) 02/06/2024   PLT 190 02/06/2024   MCHC 33.4 02/06/2024   RDW 16.6 (H) 02/06/2024   LYMPHSABS 1.5 01/17/2024   MONOABS 0.7 07/18/2022   EOSABS 0.0 01/17/2024   BASOSABS 0.0 01/17/2024     Last metabolic panel Lab Results  Component Value Date   NA 134 (L) 02/06/2024   K 3.5 02/06/2024   CL 104 02/06/2024   CO2 17 (L) 02/06/2024   BUN <5 (L) 02/06/2024   CREATININE 0.42 (L) 02/06/2024   GLUCOSE 74 02/06/2024   GFRNONAA >60 02/06/2024   GFRAA >60 03/21/2019   CALCIUM 8.5 (L) 02/06/2024   PROT 5.3 (L) 02/06/2024   ALBUMIN 3.0 (L) 02/06/2024   LABGLOB 2.5 05/12/2018   AGRATIO 1.5 05/12/2018   BILITOT 1.8 (H) 02/06/2024   ALKPHOS 77 02/06/2024   AST 106 (H) 02/06/2024   ALT 201 (H) 02/06/2024   ANIONGAP 14 02/06/2024    CBG (last 3)  No results for input(s): GLUCAP in the last 72 hours.    Coagulation Profile: No results for input(s): INR, PROTIME in the last 168 hours.   Radiology Studies: I have personally reviewed the imaging studies  MR ABDOMEN MRCP WO CONTRAST Result Date: 02/05/2024 EXAM: MRCP WITHOUT IV CONTRAST 02/05/2024 05:13:30 PM TECHNIQUE: Multisequence, multiplanar magnetic resonance images of the abdomen without intravenous contrast. MRCP sequences were performed. COMPARISON: Ultrasound of 1 day prior. CLINICAL HISTORY: Cholelithiasis; 358439 Cholelithiasis [phone]; Transaminitis with elevated lipase, possible gallstone pancreatitis, need to rule out if acute cholecystitis. FINDINGS: LIVER: Normal, without mass or intrahepatic biliary duct dilatation. GALLBLADDER AND BILIARY SYSTEM: Gallbladder is unremarkable. No gallstones. No pericholecystic edema. No choledocholithiasis. Normal common duct caliber, 3 mm. No  intrahepatic or extrahepatic ductal dilation. SPLEEN: Normal in size and morphology. PANCREAS/PANCREATIC DUCT: Normal, without duct dilatation or acute inflammation. ADRENAL GLANDS: Unremarkable. KIDNEYS: No renal mass or hydronephrosis. LYMPH NODES: No enlarged abdominal lymph nodes. VASCULATURE: Unremarkable. PERITONEUM: No ascites. ABDOMINAL WALL: No hernia. No mass. BOWEL: Normal stomach, without wall thickening. Grossly normal bowel loops. No bowel obstruction. BONES: No acute abnormality or worrisome osseous lesion. SOFT TISSUES: Unremarkable. MISCELLANEOUS: Normal heart size. No pleural fluid. IMPRESSION: 1. No acute cholecystitis or choledocholithiasis. 2. No evidence of acute pancreatitis. Electronically signed by: Rockey  Marthann MD 02/05/2024 05:28 PM EST RP Workstation: [id number]   MR 3D Recon At Scanner Result Date: 02/05/2024 EXAM: MRCP WITHOUT IV CONTRAST 02/05/2024 05:13:30 PM TECHNIQUE: Multisequence, multiplanar magnetic resonance images of the abdomen without intravenous contrast. MRCP sequences were performed. COMPARISON: Ultrasound of 1 day prior. CLINICAL HISTORY: Cholelithiasis; 358439 Cholelithiasis [phone]; Transaminitis with elevated lipase, possible gallstone pancreatitis, need to rule out if acute cholecystitis. FINDINGS: LIVER: Normal, without mass or intrahepatic biliary duct dilatation. GALLBLADDER AND BILIARY SYSTEM: Gallbladder is unremarkable. No gallstones. No pericholecystic edema. No choledocholithiasis. Normal common duct caliber, 3 mm. No intrahepatic or extrahepatic ductal dilation. SPLEEN: Normal in size and morphology. PANCREAS/PANCREATIC DUCT: Normal, without duct dilatation or acute inflammation. ADRENAL GLANDS: Unremarkable. KIDNEYS: No renal mass or hydronephrosis. LYMPH NODES: No enlarged abdominal lymph nodes. VASCULATURE: Unremarkable. PERITONEUM: No ascites. ABDOMINAL WALL: No hernia. No mass. BOWEL: Normal stomach, without wall thickening. Grossly normal bowel  loops. No bowel obstruction. BONES: No acute abnormality or worrisome osseous lesion. SOFT TISSUES: Unremarkable. MISCELLANEOUS: Normal heart size. No pleural fluid. IMPRESSION: 1. No acute cholecystitis or choledocholithiasis. 2. No evidence of acute pancreatitis. Electronically signed by: Rockey Marthann MD 02/05/2024 05:28 PM EST RP Workstation: [id number]   US  Abdomen Limited RUQ (LIVER/GB) Result Date: 02/04/2024 EXAM: Right Upper Quadrant Abdominal Ultrasound 02/04/2024 05:41:32 PM TECHNIQUE: Real-time ultrasonography of the right upper quadrant of the abdomen was performed. COMPARISON: 07/18/2022 CLINICAL HISTORY: Nausea \\T \ vomiting; Elevated liver enzymes. FINDINGS: LIVER: Normal echogenicity. No intrahepatic biliary ductal dilatation. No evidence of mass. Hepatopetal flow in the portal vein. BILIARY SYSTEM: The gallbladder is distended and full of biliary sludge. No shadowing gallstones visualized. No pericholecystic fluid or wall thickening. The common bile duct measures 4 mm. RIGHT KIDNEY: No hydronephrosis. No echogenic calculi. No mass. OTHER: No right upper quadrant ascites. IMPRESSION: 1. Distended gallbladder filled with biliary sludge. No shadowing cholecystolithiasis or changes of acute cholecystitis. Electronically signed by: Rogelia Myers MD 02/04/2024 06:23 PM EST RP Workstation: CARREN Nydia Distance M.D. [facility] 02/06/2024, 10:52 AM  Available via Epic secure chat 7am-7pm After 7 pm, please refer to night coverage provider listed on amion.    "

## 2024-02-06 NOTE — Plan of Care (Signed)
  Problem: Education: Goal: Knowledge of General Education information will improve Description: Including pain rating scale, medication(s)/side effects and non-pharmacologic comfort measures Outcome: Progressing   Problem: Activity: Goal: Risk for activity intolerance will decrease Outcome: Progressing   Problem: Elimination: Goal: Will not experience complications related to urinary retention Outcome: Progressing   Problem: Pain Managment: Goal: General experience of comfort will improve and/or be controlled Outcome: Progressing   Problem: Safety: Goal: Ability to remain free from injury will improve Outcome: Progressing

## 2024-02-06 NOTE — Progress Notes (Signed)
 Subjective: No new complaints.  Objective: Vital signs in last 24 hours: Temp:  [97.7 F (36.5 C)-98.2 F (36.8 C)] 97.7 F (36.5 C) (12/21 0537) Pulse Rate:  [76-92] 76 (12/21 0537) Resp:  [16-17] 17 (12/21 0537) BP: (102-113)/(51-62) 102/53 (12/21 0537) SpO2:  [100 %] 100 % (12/21 0537) Last BM Date : 02/04/24  Intake/Output from previous day: 12/20 0701 - 12/21 0700 In: 1852.2 [I.V.:1746.5; IV Piggyback:105.7] Out: -  Intake/Output this shift: No intake/output data recorded.  General appearance: alert and no distress GI: soft, non-tender; bowel sounds normal; no masses,  no organomegaly  Lab Results: Recent Labs    02/04/24 1536 02/05/24 0634 02/06/24 0503  WBC 11.3* 8.1 7.9  HGB 14.1 10.5* 9.7*  HCT 42.8 31.4* 29.0*  PLT 265 207 190   BMET Recent Labs    02/04/24 1536 02/05/24 0634 02/06/24 0503  NA 136 135 134*  K 4.2 3.2* 3.5  CL 98 106 104  CO2 19* 14* 17*  GLUCOSE 95 86 74  BUN 5* <5* <5*  CREATININE 0.63 0.39* 0.42*  CALCIUM 10.8* 8.6* 8.5*   LFT Recent Labs    02/06/24 0503  PROT 5.3*  ALBUMIN 3.0*  AST 106*  ALT 201*  ALKPHOS 77  BILITOT 1.8*   PT/INR No results for input(s): LABPROT, INR in the last 72 hours. Hepatitis Panel No results for input(s): HEPBSAG, HCVAB, HEPAIGM, HEPBIGM in the last 72 hours. C-Diff No results for input(s): CDIFFTOX in the last 72 hours. Fecal Lactopherrin No results for input(s): FECLLACTOFRN in the last 72 hours.  Studies/Results: MR ABDOMEN MRCP WO CONTRAST Result Date: 02/05/2024 EXAM: MRCP WITHOUT IV CONTRAST 02/05/2024 05:13:30 PM TECHNIQUE: Multisequence, multiplanar magnetic resonance images of the abdomen without intravenous contrast. MRCP sequences were performed. COMPARISON: Ultrasound of 1 day prior. CLINICAL HISTORY: Cholelithiasis; 358439 Cholelithiasis [phone]; Transaminitis with elevated lipase, possible gallstone pancreatitis, need to rule out if acute cholecystitis.  FINDINGS: LIVER: Normal, without mass or intrahepatic biliary duct dilatation. GALLBLADDER AND BILIARY SYSTEM: Gallbladder is unremarkable. No gallstones. No pericholecystic edema. No choledocholithiasis. Normal common duct caliber, 3 mm. No intrahepatic or extrahepatic ductal dilation. SPLEEN: Normal in size and morphology. PANCREAS/PANCREATIC DUCT: Normal, without duct dilatation or acute inflammation. ADRENAL GLANDS: Unremarkable. KIDNEYS: No renal mass or hydronephrosis. LYMPH NODES: No enlarged abdominal lymph nodes. VASCULATURE: Unremarkable. PERITONEUM: No ascites. ABDOMINAL WALL: No hernia. No mass. BOWEL: Normal stomach, without wall thickening. Grossly normal bowel loops. No bowel obstruction. BONES: No acute abnormality or worrisome osseous lesion. SOFT TISSUES: Unremarkable. MISCELLANEOUS: Normal heart size. No pleural fluid. IMPRESSION: 1. No acute cholecystitis or choledocholithiasis. 2. No evidence of acute pancreatitis. Electronically signed by: Rockey Kilts MD 02/05/2024 05:28 PM EST RP Workstation: [id number]   MR 3D Recon At Scanner Result Date: 02/05/2024 EXAM: MRCP WITHOUT IV CONTRAST 02/05/2024 05:13:30 PM TECHNIQUE: Multisequence, multiplanar magnetic resonance images of the abdomen without intravenous contrast. MRCP sequences were performed. COMPARISON: Ultrasound of 1 day prior. CLINICAL HISTORY: Cholelithiasis; 358439 Cholelithiasis [phone]; Transaminitis with elevated lipase, possible gallstone pancreatitis, need to rule out if acute cholecystitis. FINDINGS: LIVER: Normal, without mass or intrahepatic biliary duct dilatation. GALLBLADDER AND BILIARY SYSTEM: Gallbladder is unremarkable. No gallstones. No pericholecystic edema. No choledocholithiasis. Normal common duct caliber, 3 mm. No intrahepatic or extrahepatic ductal dilation. SPLEEN: Normal in size and morphology. PANCREAS/PANCREATIC DUCT: Normal, without duct dilatation or acute inflammation. ADRENAL GLANDS: Unremarkable. KIDNEYS:  No renal mass or hydronephrosis. LYMPH NODES: No enlarged abdominal lymph nodes. VASCULATURE: Unremarkable. PERITONEUM: No ascites. ABDOMINAL WALL:  No hernia. No mass. BOWEL: Normal stomach, without wall thickening. Grossly normal bowel loops. No bowel obstruction. BONES: No acute abnormality or worrisome osseous lesion. SOFT TISSUES: Unremarkable. MISCELLANEOUS: Normal heart size. No pleural fluid. IMPRESSION: 1. No acute cholecystitis or choledocholithiasis. 2. No evidence of acute pancreatitis. Electronically signed by: Rockey Kilts MD 02/05/2024 05:28 PM EST RP Workstation: [id number]   US  Abdomen Limited RUQ (LIVER/GB) Result Date: 02/04/2024 EXAM: Right Upper Quadrant Abdominal Ultrasound 02/04/2024 05:41:32 PM TECHNIQUE: Real-time ultrasonography of the right upper quadrant of the abdomen was performed. COMPARISON: 07/18/2022 CLINICAL HISTORY: Nausea \\T \ vomiting; Elevated liver enzymes. FINDINGS: LIVER: Normal echogenicity. No intrahepatic biliary ductal dilatation. No evidence of mass. Hepatopetal flow in the portal vein. BILIARY SYSTEM: The gallbladder is distended and full of biliary sludge. No shadowing gallstones visualized. No pericholecystic fluid or wall thickening. The common bile duct measures 4 mm. RIGHT KIDNEY: No hydronephrosis. No echogenic calculi. No mass. OTHER: No right upper quadrant ascites. IMPRESSION: 1. Distended gallbladder filled with biliary sludge. No shadowing cholecystolithiasis or changes of acute cholecystitis. Electronically signed by: Rogelia Myers MD 02/04/2024 06:23 PM EST RP Workstation: [id number]    Medications: Scheduled:  pantoprazole  (PROTONIX ) IV  40 mg Intravenous Daily   scopolamine   1 patch Transdermal Once   Continuous:  famotidine  20 mg (02/05/24 2359)   lactated ringers  125 mL/hr at 02/06/24 0719    Assessment/Plan: 1) Cholelithiasis in the form of sludge. 2) Elevated liver enzymes. 3) Upper abdominal pain. 4) Second trimester  pregnancy.   The MRCP was negative for any stones, but she does have a fair degree of sludge in her gallbladder.  It will be beneficial for her to have a lap chole during this second trimester.  Plan: 1) Further evaluation and treatment per Surgery. 2) Signing off.  LOS: 1 day   Beth Valdez D 02/06/2024, 9:40 AM

## 2024-02-06 NOTE — Progress Notes (Signed)
 "      Subjective: Patient is feeling much better with supportive care. She denies any RUQ pain or epigastric pain, has only had lower abdominal pain. MRCP with no common duct stones or signs of pancreatitis. Lipase is 222 today. Transaminases mildly elevated, Tbili down to 1.8.   Objective: Vital signs in last 24 hours: Temp:  [97.7 F (36.5 C)-98.2 F (36.8 C)] 98.1 F (36.7 C) (12/21 1005) Pulse Rate:  [76-89] 89 (12/21 1005) Resp:  [15-17] 15 (12/21 1005) BP: (102-112)/(51-61) 105/59 (12/21 1005) SpO2:  [100 %] 100 % (12/21 1005) Last BM Date : 02/04/24  Intake/Output from previous day: 12/20 0701 - 12/21 0700 In: 1852.2 [I.V.:1746.5; IV Piggyback:105.7] Out: -  Intake/Output this shift: Total I/O In: 60 [P.O.:60] Out: -   PE: General: resting comfortably, NAD Neuro: alert and oriented, no focal deficits Resp: normal work of breathing on room air Abdomen: soft, nondistended, nontender to palpation. Extremities: warm and well-perfused   Lab Results:  Recent Labs    02/05/24 0634 02/06/24 0503  WBC 8.1 7.9  HGB 10.5* 9.7*  HCT 31.4* 29.0*  PLT 207 190   BMET Recent Labs    02/05/24 0634 02/06/24 0503  NA 135 134*  K 3.2* 3.5  CL 106 104  CO2 14* 17*  GLUCOSE 86 74  BUN <5* <5*  CREATININE 0.39* 0.42*  CALCIUM 8.6* 8.5*   PT/INR No results for input(s): LABPROT, INR in the last 72 hours. CMP     Component Value Date/Time   NA 134 (L) 02/06/2024 0503   NA 138 05/12/2018 1153   K 3.5 02/06/2024 0503   CL 104 02/06/2024 0503   CO2 17 (L) 02/06/2024 0503   GLUCOSE 74 02/06/2024 0503   BUN <5 (L) 02/06/2024 0503   BUN 4 (L) 05/12/2018 1153   CREATININE 0.42 (L) 02/06/2024 0503   CALCIUM 8.5 (L) 02/06/2024 0503   PROT 5.3 (L) 02/06/2024 0503   PROT 6.2 05/12/2018 1153   ALBUMIN 3.0 (L) 02/06/2024 0503   ALBUMIN 3.7 (L) 05/12/2018 1153   AST 106 (H) 02/06/2024 0503   ALT 201 (H) 02/06/2024 0503   ALKPHOS 77 02/06/2024 0503   BILITOT 1.8  (H) 02/06/2024 0503   BILITOT 0.2 05/12/2018 1153   GFRNONAA >60 02/06/2024 0503   GFRAA >60 03/21/2019 1407   Lipase     Component Value Date/Time   LIPASE 222 (H) 02/06/2024 0503       Studies/Results: MR ABDOMEN MRCP WO CONTRAST Result Date: 02/05/2024 EXAM: MRCP WITHOUT IV CONTRAST 02/05/2024 05:13:30 PM TECHNIQUE: Multisequence, multiplanar magnetic resonance images of the abdomen without intravenous contrast. MRCP sequences were performed. COMPARISON: Ultrasound of 1 day prior. CLINICAL HISTORY: Cholelithiasis; [id number] Cholelithiasis [phone]; Transaminitis with elevated lipase, possible gallstone pancreatitis, need to rule out if acute cholecystitis. FINDINGS: LIVER: Normal, without mass or intrahepatic biliary duct dilatation. GALLBLADDER AND BILIARY SYSTEM: Gallbladder is unremarkable. No gallstones. No pericholecystic edema. No choledocholithiasis. Normal common duct caliber, 3 mm. No intrahepatic or extrahepatic ductal dilation. SPLEEN: Normal in size and morphology. PANCREAS/PANCREATIC DUCT: Normal, without duct dilatation or acute inflammation. ADRENAL GLANDS: Unremarkable. KIDNEYS: No renal mass or hydronephrosis. LYMPH NODES: No enlarged abdominal lymph nodes. VASCULATURE: Unremarkable. PERITONEUM: No ascites. ABDOMINAL WALL: No hernia. No mass. BOWEL: Normal stomach, without wall thickening. Grossly normal bowel loops. No bowel obstruction. BONES: No acute abnormality or worrisome osseous lesion. SOFT TISSUES: Unremarkable. MISCELLANEOUS: Normal heart size. No pleural fluid. IMPRESSION: 1. No acute cholecystitis or choledocholithiasis. 2. No  evidence of acute pancreatitis. Electronically signed by: Rockey Kilts MD 02/05/2024 05:28 PM EST RP Workstation: [id number]   MR 3D Recon At Scanner Result Date: 02/05/2024 EXAM: MRCP WITHOUT IV CONTRAST 02/05/2024 05:13:30 PM TECHNIQUE: Multisequence, multiplanar magnetic resonance images of the abdomen without intravenous contrast. MRCP  sequences were performed. COMPARISON: Ultrasound of 1 day prior. CLINICAL HISTORY: Cholelithiasis; [id number] Cholelithiasis [phone]; Transaminitis with elevated lipase, possible gallstone pancreatitis, need to rule out if acute cholecystitis. FINDINGS: LIVER: Normal, without mass or intrahepatic biliary duct dilatation. GALLBLADDER AND BILIARY SYSTEM: Gallbladder is unremarkable. No gallstones. No pericholecystic edema. No choledocholithiasis. Normal common duct caliber, 3 mm. No intrahepatic or extrahepatic ductal dilation. SPLEEN: Normal in size and morphology. PANCREAS/PANCREATIC DUCT: Normal, without duct dilatation or acute inflammation. ADRENAL GLANDS: Unremarkable. KIDNEYS: No renal mass or hydronephrosis. LYMPH NODES: No enlarged abdominal lymph nodes. VASCULATURE: Unremarkable. PERITONEUM: No ascites. ABDOMINAL WALL: No hernia. No mass. BOWEL: Normal stomach, without wall thickening. Grossly normal bowel loops. No bowel obstruction. BONES: No acute abnormality or worrisome osseous lesion. SOFT TISSUES: Unremarkable. MISCELLANEOUS: Normal heart size. No pleural fluid. IMPRESSION: 1. No acute cholecystitis or choledocholithiasis. 2. No evidence of acute pancreatitis. Electronically signed by: Rockey Kilts MD 02/05/2024 05:28 PM EST RP Workstation: [id number]   US  Abdomen Limited RUQ (LIVER/GB) Result Date: 02/04/2024 EXAM: Right Upper Quadrant Abdominal Ultrasound 02/04/2024 05:41:32 PM TECHNIQUE: Real-time ultrasonography of the right upper quadrant of the abdomen was performed. COMPARISON: 07/18/2022 CLINICAL HISTORY: Nausea \\T \ vomiting; Elevated liver enzymes. FINDINGS: LIVER: Normal echogenicity. No intrahepatic biliary ductal dilatation. No evidence of mass. Hepatopetal flow in the portal vein. BILIARY SYSTEM: The gallbladder is distended and full of biliary sludge. No shadowing gallstones visualized. No pericholecystic fluid or wall thickening. The common bile duct measures 4 mm. RIGHT KIDNEY: No  hydronephrosis. No echogenic calculi. No mass. OTHER: No right upper quadrant ascites. IMPRESSION: 1. Distended gallbladder filled with biliary sludge. No shadowing cholecystolithiasis or changes of acute cholecystitis. Electronically signed by: Rogelia Myers MD 02/04/2024 06:23 PM EST RP Workstation: [id number]    Anti-infectives: Anti-infectives (From admission, onward)    None        Assessment/Plan 23 yo female at [redacted]wks gestation, presenting with several weeks of nausea and vomiting with mildly elevated LFTs. RUQ US  showed gallbladder sludge, but no stones. No choledocholithiasis or CBD dilation present on MRCP. Her lipase is only mildly elevated, there are no signs of pancreatitis on MRI, and she has not had any upper abdominal pain - clinically this is not consistent with pancreatitis. It is possible she may have passed a very small stone via the common bile duct, however she has not had any associated pain. Her primary symptom is nausea and vomiting for the last several weeks. Lab abnormalities may possibly related to volume depletion from frequent emesis.   I do not recommend cholecystectomy at this time, however we will continue to follow and trend LFTs and reassess possible need for cholecystectomy in future. Discussed this with the patient and with Dr. Davia at bedside.    LOS: 1 day    Leonor Dawn, MD [facility] General, Hepatobiliary and Pancreatic Surgery 02/06/2024 12:42 PM  "

## 2024-02-06 NOTE — Plan of Care (Signed)

## 2024-02-07 DIAGNOSIS — R7989 Other specified abnormal findings of blood chemistry: Secondary | ICD-10-CM

## 2024-02-07 DIAGNOSIS — Z3480 Encounter for supervision of other normal pregnancy, unspecified trimester: Secondary | ICD-10-CM

## 2024-02-07 DIAGNOSIS — Z3A13 13 weeks gestation of pregnancy: Secondary | ICD-10-CM | POA: Diagnosis not present

## 2024-02-07 DIAGNOSIS — E876 Hypokalemia: Secondary | ICD-10-CM | POA: Diagnosis present

## 2024-02-07 DIAGNOSIS — R112 Nausea with vomiting, unspecified: Secondary | ICD-10-CM | POA: Diagnosis not present

## 2024-02-07 LAB — COMPREHENSIVE METABOLIC PANEL WITH GFR
ALT: 221 U/L — ABNORMAL HIGH (ref 0–44)
AST: 99 U/L — ABNORMAL HIGH (ref 15–41)
Albumin: 3.1 g/dL — ABNORMAL LOW (ref 3.5–5.0)
Alkaline Phosphatase: 82 U/L (ref 38–126)
Anion gap: 13 (ref 5–15)
BUN: <5 mg/dL — ABNORMAL LOW (ref 6–20)
CO2: 17 mmol/L — ABNORMAL LOW (ref 22–32)
Calcium: 8.4 mg/dL — ABNORMAL LOW (ref 8.9–10.3)
Chloride: 104 mmol/L (ref 98–111)
Creatinine, Ser: 0.32 mg/dL — ABNORMAL LOW (ref 0.44–1.00)
GFR, Estimated: >60 mL/min
Glucose, Bld: 75 mg/dL (ref 70–99)
Potassium: 2.9 mmol/L — ABNORMAL LOW (ref 3.5–5.1)
Sodium: 133 mmol/L — ABNORMAL LOW (ref 135–145)
Total Bilirubin: 1.4 mg/dL — ABNORMAL HIGH (ref 0.0–1.2)
Total Protein: 5.5 g/dL — ABNORMAL LOW (ref 6.5–8.1)

## 2024-02-07 LAB — LIPASE, BLOOD: Lipase: 224 U/L — ABNORMAL HIGH (ref 11–51)

## 2024-02-07 LAB — MAGNESIUM: Magnesium: 1.4 mg/dL — ABNORMAL LOW (ref 1.7–2.4)

## 2024-02-07 LAB — PHOSPHORUS: Phosphorus: 3.1 mg/dL (ref 2.5–4.6)

## 2024-02-07 MED ORDER — POTASSIUM CHLORIDE 10 MEQ/100ML IV SOLN
10.0000 meq | INTRAVENOUS | Status: AC
  Administered 2024-02-07 (×5): 10 meq via INTRAVENOUS
  Filled 2024-02-07 (×5): qty 100

## 2024-02-07 MED ORDER — PROMETHAZINE HCL 25 MG/ML IJ SOLN
25.0000 mg | INTRAVENOUS | Status: AC
  Administered 2024-02-07: 25 mg via INTRAVENOUS
  Filled 2024-02-07 (×2): qty 1

## 2024-02-07 MED ORDER — PYRIDOXINE HCL 100 MG/ML IJ SOLN
100.0000 mg | Freq: Every day | INTRAMUSCULAR | Status: DC
  Administered 2024-02-07 – 2024-02-08 (×2): 100 mg via INTRAVENOUS
  Filled 2024-02-07 (×3): qty 1

## 2024-02-07 MED ORDER — MAGNESIUM SULFATE 4 GM/100ML IV SOLN
4.0000 g | Freq: Once | INTRAVENOUS | Status: AC
  Administered 2024-02-07: 4 g via INTRAVENOUS
  Filled 2024-02-07: qty 100

## 2024-02-07 MED ORDER — MAGNESIUM SULFATE 50 % IJ SOLN: 3.0000 g | Freq: Once | INTRAVENOUS | Status: DC

## 2024-02-07 NOTE — Progress Notes (Signed)
 "      Subjective: Per report, patient had nausea and vomited after trying clear liquids yesterday. Has been doing sips of water since then and denies further vomiting. Would like to try eating but is anxious about. She still denies any abdominal pain.   Objective: Vital signs in last 24 hours: Temp:  [98 F (36.7 C)-98.3 F (36.8 C)] 98.1 F (36.7 C) (12/22 1019) Pulse Rate:  [77-85] 82 (12/22 1019) Resp:  [16-18] 18 (12/22 1019) BP: (104-112)/(50-60) 109/60 (12/22 1019) SpO2:  [100 %] 100 % (12/22 1019) Last BM Date : 02/04/24  Intake/Output from previous day: 12/21 0701 - 12/22 0700 In: 1566.7 [P.O.:120; I.V.:1337.5; IV Piggyback:109.2] Out: -  Intake/Output this shift: No intake/output data recorded.  PE: General: resting comfortably, NAD Neuro: alert and oriented, no focal deficits Resp: normal work of breathing on room air Abdomen: soft, nondistended, nontender to palpation. Specifically, no RUQ or epigastric tenderness. Extremities: warm and well-perfused   Lab Results:  Recent Labs    02/05/24 0634 02/06/24 0503  WBC 8.1 7.9  HGB 10.5* 9.7*  HCT 31.4* 29.0*  PLT 207 190   BMET Recent Labs    02/06/24 0503 02/07/24 0449  NA 134* 133*  K 3.5 2.9*  CL 104 104  CO2 17* 17*  GLUCOSE 74 75  BUN <5* <5*  CREATININE 0.42* 0.32*  CALCIUM 8.5* 8.4*   PT/INR No results for input(s): LABPROT, INR in the last 72 hours. CMP     Component Value Date/Time   NA 133 (L) 02/07/2024 0449   NA 138 05/12/2018 1153   K 2.9 (L) 02/07/2024 0449   CL 104 02/07/2024 0449   CO2 17 (L) 02/07/2024 0449   GLUCOSE 75 02/07/2024 0449   BUN <5 (L) 02/07/2024 0449   BUN 4 (L) 05/12/2018 1153   CREATININE 0.32 (L) 02/07/2024 0449   CALCIUM 8.4 (L) 02/07/2024 0449   PROT 5.5 (L) 02/07/2024 0449   PROT 6.2 05/12/2018 1153   ALBUMIN 3.1 (L) 02/07/2024 0449   ALBUMIN 3.7 (L) 05/12/2018 1153   AST 99 (H) 02/07/2024 0449   ALT 221 (H) 02/07/2024 0449   ALKPHOS 82  02/07/2024 0449   BILITOT 1.4 (H) 02/07/2024 0449   BILITOT 0.2 05/12/2018 1153   GFRNONAA >60 02/07/2024 0449   GFRAA >60 03/21/2019 1407   Lipase     Component Value Date/Time   LIPASE 224 (H) 02/07/2024 0449       Studies/Results: MR ABDOMEN MRCP WO CONTRAST Result Date: 02/05/2024 EXAM: MRCP WITHOUT IV CONTRAST 02/05/2024 05:13:30 PM TECHNIQUE: Multisequence, multiplanar magnetic resonance images of the abdomen without intravenous contrast. MRCP sequences were performed. COMPARISON: Ultrasound of 1 day prior. CLINICAL HISTORY: Cholelithiasis; 358439 Cholelithiasis [phone]; Transaminitis with elevated lipase, possible gallstone pancreatitis, need to rule out if acute cholecystitis. FINDINGS: LIVER: Normal, without mass or intrahepatic biliary duct dilatation. GALLBLADDER AND BILIARY SYSTEM: Gallbladder is unremarkable. No gallstones. No pericholecystic edema. No choledocholithiasis. Normal common duct caliber, 3 mm. No intrahepatic or extrahepatic ductal dilation. SPLEEN: Normal in size and morphology. PANCREAS/PANCREATIC DUCT: Normal, without duct dilatation or acute inflammation. ADRENAL GLANDS: Unremarkable. KIDNEYS: No renal mass or hydronephrosis. LYMPH NODES: No enlarged abdominal lymph nodes. VASCULATURE: Unremarkable. PERITONEUM: No ascites. ABDOMINAL WALL: No hernia. No mass. BOWEL: Normal stomach, without wall thickening. Grossly normal bowel loops. No bowel obstruction. BONES: No acute abnormality or worrisome osseous lesion. SOFT TISSUES: Unremarkable. MISCELLANEOUS: Normal heart size. No pleural fluid. IMPRESSION: 1. No acute cholecystitis or choledocholithiasis. 2. No  evidence of acute pancreatitis. Electronically signed by: Rockey Kilts MD 02/05/2024 05:28 PM EST RP Workstation: [id number]   MR 3D Recon At Scanner Result Date: 02/05/2024 EXAM: MRCP WITHOUT IV CONTRAST 02/05/2024 05:13:30 PM TECHNIQUE: Multisequence, multiplanar magnetic resonance images of the abdomen without  intravenous contrast. MRCP sequences were performed. COMPARISON: Ultrasound of 1 day prior. CLINICAL HISTORY: Cholelithiasis; 358439 Cholelithiasis [phone]; Transaminitis with elevated lipase, possible gallstone pancreatitis, need to rule out if acute cholecystitis. FINDINGS: LIVER: Normal, without mass or intrahepatic biliary duct dilatation. GALLBLADDER AND BILIARY SYSTEM: Gallbladder is unremarkable. No gallstones. No pericholecystic edema. No choledocholithiasis. Normal common duct caliber, 3 mm. No intrahepatic or extrahepatic ductal dilation. SPLEEN: Normal in size and morphology. PANCREAS/PANCREATIC DUCT: Normal, without duct dilatation or acute inflammation. ADRENAL GLANDS: Unremarkable. KIDNEYS: No renal mass or hydronephrosis. LYMPH NODES: No enlarged abdominal lymph nodes. VASCULATURE: Unremarkable. PERITONEUM: No ascites. ABDOMINAL WALL: No hernia. No mass. BOWEL: Normal stomach, without wall thickening. Grossly normal bowel loops. No bowel obstruction. BONES: No acute abnormality or worrisome osseous lesion. SOFT TISSUES: Unremarkable. MISCELLANEOUS: Normal heart size. No pleural fluid. IMPRESSION: 1. No acute cholecystitis or choledocholithiasis. 2. No evidence of acute pancreatitis. Electronically signed by: Rockey Kilts MD 02/05/2024 05:28 PM EST RP Workstation: [id number]    Anti-infectives: Anti-infectives (From admission, onward)    None        Assessment/Plan 23 yo female at [redacted]wks gestation, presenting with several weeks of nausea and vomiting with mildly elevated LFTs and lipase. RUQ US  showed gallbladder sludge, but no stones. No choledocholithiasis or CBD dilation present on MRCP. Her lipase is only mildly elevated, there are no signs of pancreatitis on MRI, and she has not had any upper abdominal pain - clinically this is not consistent with pancreatitis. There is no biliary ductal dilation on MRI, and she did not have any recent pain to suggest passage of a small stone via the  common bile duct, although this still remains a possibility. If this were the cause of her nausea and vomiting however, I would have expected those symptoms to resolve by now. She has gallbladder sludge, but other than nausea, no other symptoms consistent with biliary colic. I would not recommend cholecystectomy at this time. Would appreciate input from OB/GYN regarding whether or not symptoms and lab abnormalities could be related to pregnancy. We will continue to follow for now and trend LFTs, and I will plan to see the patient for follow up in about a month to re-evaluate her symptoms and determine need for elective cholecystectomy. Ok to advance diet as tolerated.    LOS: 2 days    Leonor Dawn, MD [facility] Surgery General, Hepatobiliary and Pancreatic Surgery 02/07/2024 10:26 AM  "

## 2024-02-07 NOTE — Progress Notes (Signed)
 "          [facility]                                                                              Beth Valdez, is a 23 y.o. female, DOB - 03/24/2000, FMW:[id number] Admit date - 02/04/2024    Outpatient Primary MD for the patient is [facility]  LOS - 2  days  Chief Complaint  Patient presents with   Shortness of Breath   Nausea   Emesis       Brief summary   Patient is a 23 year old female, [redacted] weeks pregnant and presented with intractable nausea and vomiting.  Reports that she has been having persistent nausea and vomiting for the last 4 weeks and worse over the last couple of days with green/brown emesis.  No bloody emesis.  Also reported significant burning heartburn in the upper abdomen. Labs showed WBCs 11.3, AST 108, ALT 207, alk phos 123, total bilirubin 3.0, lipase 120, amylase 133 RUQ US  showed distended gallbladder with biliary sludge, no cholelithiasis. Patient was admitted to medicine service for likely pancreatitis   Assessment & Plan       Intractable nausea and vomiting, transaminitis, acute pancreatitis vs hyperemesis gravidarum -Presented with intractable nausea and vomiting with transaminitis, elevated lipase -RUQ US  showed distended gallbladder with biliary sludge, no cholelithiasis. - MRCP abdomen showed no acute cholecystitis, choledocholithiasis or evidence of acute pancreatitis. - Reviewed GI recommendations, signed off -Surgery following closely, seen by Dr. Dasie today, has gallbladder sludge but other than that no other symptoms consistent with biliary colic, clinically does not have pancreatitis, no abdominal pain, not recommending cholecystectomy at this time.  Requested input from OB.  I have reached out to Dr. Zina to reevaluate the patient -  Hyperemesis gravidarum commonly causes elevated liver enzymes due to severe dehydration/malnutrition.  - Hepatitis panel negative - yesterday could not tolerate clear  liquid diet however wants to try soft diet today - LFTs, lipase still elevated   Dehydration, mild hypercalcemia - Start clear liquid diet, continue gentle hydration  Severe hypokalemia, hypomagnesemia - Placed on IV replacement    [redacted] weeks gestation of pregnancy - Continue to follow-up with OB/GYN, had first prenatal visit on 12/1.  Evaluated by Dr. Zina on 12/20 - Requested OB Dr. Zina to reevaluate today given concern for possible hyperemesis gravidarum   GERD - placed on Protonix    Estimated body mass index is 28.57 kg/m as calculated from the following:   Height as of this encounter: 5' 7 (1.702 m).   Weight as of this encounter: 82.7 kg.  Code Status: Full code DVT Prophylaxis:  SCDs Start: 02/04/24 2243   Level of Care: Level of care: Med-Surg Family Communication: Updated patient's mother on the phone on 12/21 Disposition Plan:      Remains inpatient appropriate:   Hopefully within next 24 to 48 hours if continues to improve and tolerating solid diet   Procedures:    Consultants:   Gastroenterology General Surgery OB  Antimicrobials:   Anti-infectives (From admission, onward)    None          Medications  pantoprazole  (PROTONIX ) IV  40 mg Intravenous Daily   scopolamine   1 patch Transdermal Once      Subjective:   [name] was seen and examined today.  Yesterday could not tolerate clear liquid diet and vomited.  Does not feel a whole lot better however wants to try soft diet today.  No fever chills, chest pain or any acute abdominal pain or diarrhea.   Objective:   Vitals:   02/06/24 1802 02/06/24 2232 02/07/24 0609 02/07/24 1019  BP: (!) 110/52 (!) 104/50 (!) 112/58 109/60  Pulse: 77 79 85 82  Resp: 16 17 17 18   Temp: 98.1 F (36.7 C) 98.3 F (36.8 C) 98 F (36.7 C) 98.1 F (36.7 C)  TempSrc: Oral Oral Oral Oral  SpO2: 100% 100% 100% 100%  Weight:      Height:        Intake/Output Summary (Last 24 hours) at 02/07/2024  1145 Last data filed at 02/07/2024 0830 Gross per 24 hour  Intake 1506.72 ml  Output --  Net 1506.72 ml     Wt Readings from Last 3 Encounters:  02/04/24 82.7 kg  01/17/24 90.3 kg  01/03/24 92.5 kg   Physical Exam General: Alert and oriented x 3, NAD Cardiovascular: S1 S2 clear, RRR.  Respiratory: CTAB, no wheezing Gastrointestinal: Soft, nontender, nondistended, NBS Ext: no pedal edema bilaterally Neuro: no new deficits Psych: Normal affect     Data Reviewed:  I have personally reviewed following labs    CBC Lab Results  Component Value Date   WBC 7.9 02/06/2024   RBC 4.37 02/06/2024   HGB 9.7 (L) 02/06/2024   HCT 29.0 (L) 02/06/2024   MCV 66.4 (L) 02/06/2024   MCH 22.2 (L) 02/06/2024   PLT 190 02/06/2024   MCHC 33.4 02/06/2024   RDW 16.6 (H) 02/06/2024   LYMPHSABS 1.5 01/17/2024   MONOABS 0.7 07/18/2022   EOSABS 0.0 01/17/2024   BASOSABS 0.0 01/17/2024     Last metabolic panel Lab Results  Component Value Date   NA 133 (L) 02/07/2024   K 2.9 (L) 02/07/2024   CL 104 02/07/2024   CO2 17 (L) 02/07/2024   BUN <5 (L) 02/07/2024   CREATININE 0.32 (L) 02/07/2024   GLUCOSE 75 02/07/2024   GFRNONAA >60 02/07/2024   GFRAA >60 03/21/2019   CALCIUM 8.4 (L) 02/07/2024   PHOS 3.1 02/07/2024   PROT 5.5 (L) 02/07/2024   ALBUMIN 3.1 (L) 02/07/2024   LABGLOB 2.5 05/12/2018   AGRATIO 1.5 05/12/2018   BILITOT 1.4 (H) 02/07/2024   ALKPHOS 82 02/07/2024   AST 99 (H) 02/07/2024   ALT 221 (H) 02/07/2024   ANIONGAP 13 02/07/2024    CBG (last 3)  No results for input(s): GLUCAP in the last 72 hours.    Coagulation Profile: No results for input(s): INR, PROTIME in the last 168 hours.   Radiology Studies: I have personally reviewed the imaging studies  MR ABDOMEN MRCP WO CONTRAST Result Date: 02/05/2024 EXAM: MRCP WITHOUT IV CONTRAST 02/05/2024 05:13:30 PM TECHNIQUE: Multisequence, multiplanar magnetic resonance images of the abdomen without intravenous  contrast. MRCP sequences were performed. COMPARISON: Ultrasound of 1 day prior. CLINICAL HISTORY: Cholelithiasis; 358439 Cholelithiasis [phone]; Transaminitis with elevated lipase, possible gallstone pancreatitis, need to rule out if acute cholecystitis. FINDINGS: LIVER: Normal, without mass or intrahepatic biliary duct dilatation. GALLBLADDER AND BILIARY SYSTEM: Gallbladder is unremarkable. No gallstones. No pericholecystic edema. No choledocholithiasis. Normal common duct caliber, 3 mm. No intrahepatic or extrahepatic ductal dilation. SPLEEN: Normal in size and morphology.  PANCREAS/PANCREATIC DUCT: Normal, without duct dilatation or acute inflammation. ADRENAL GLANDS: Unremarkable. KIDNEYS: No renal mass or hydronephrosis. LYMPH NODES: No enlarged abdominal lymph nodes. VASCULATURE: Unremarkable. PERITONEUM: No ascites. ABDOMINAL WALL: No hernia. No mass. BOWEL: Normal stomach, without wall thickening. Grossly normal bowel loops. No bowel obstruction. BONES: No acute abnormality or worrisome osseous lesion. SOFT TISSUES: Unremarkable. MISCELLANEOUS: Normal heart size. No pleural fluid. IMPRESSION: 1. No acute cholecystitis or choledocholithiasis. 2. No evidence of acute pancreatitis. Electronically signed by: Rockey Kilts MD 02/05/2024 05:28 PM EST RP Workstation: [id number]   MR 3D Recon At Scanner Result Date: 02/05/2024 EXAM: MRCP WITHOUT IV CONTRAST 02/05/2024 05:13:30 PM TECHNIQUE: Multisequence, multiplanar magnetic resonance images of the abdomen without intravenous contrast. MRCP sequences were performed. COMPARISON: Ultrasound of 1 day prior. CLINICAL HISTORY: Cholelithiasis; 358439 Cholelithiasis [phone]; Transaminitis with elevated lipase, possible gallstone pancreatitis, need to rule out if acute cholecystitis. FINDINGS: LIVER: Normal, without mass or intrahepatic biliary duct dilatation. GALLBLADDER AND BILIARY SYSTEM: Gallbladder is unremarkable. No gallstones. No pericholecystic edema. No  choledocholithiasis. Normal common duct caliber, 3 mm. No intrahepatic or extrahepatic ductal dilation. SPLEEN: Normal in size and morphology. PANCREAS/PANCREATIC DUCT: Normal, without duct dilatation or acute inflammation. ADRENAL GLANDS: Unremarkable. KIDNEYS: No renal mass or hydronephrosis. LYMPH NODES: No enlarged abdominal lymph nodes. VASCULATURE: Unremarkable. PERITONEUM: No ascites. ABDOMINAL WALL: No hernia. No mass. BOWEL: Normal stomach, without wall thickening. Grossly normal bowel loops. No bowel obstruction. BONES: No acute abnormality or worrisome osseous lesion. SOFT TISSUES: Unremarkable. MISCELLANEOUS: Normal heart size. No pleural fluid. IMPRESSION: 1. No acute cholecystitis or choledocholithiasis. 2. No evidence of acute pancreatitis. Electronically signed by: Rockey Kilts MD 02/05/2024 05:28 PM EST RP Workstation: [id number]       Nydia Distance M.D. [facility] 02/07/2024, 11:45 AM  Available via Epic secure chat 7am-7pm After 7 pm, please refer to night coverage provider listed on amion.    "

## 2024-02-07 NOTE — TOC Initial Note (Signed)
 Transition of Care [facility]) - Initial/Assessment Note    Patient Details  Name: Beth Valdez MRN: [id number] Date of Birth: 12-Sep-2000  [facility] CM/SW Contact:    Beth Valdez, LCSWA Phone Number: 02/07/2024, 8:59 AM  Clinical Narrative:                 Pt admitted from home due to intractable n/v. Pt is [redacted] weeks pregnant. No current ICM needs. Please consult as needs arise.    Barriers to Discharge: Continued Medical Work up   Patient Goals and CMS Choice            Expected Discharge Plan and Services       Living arrangements for the past 2 months: Apartment                                      Prior Living Arrangements/Services Living arrangements for the past 2 months: Apartment Lives with:: Self Patient language and need for interpreter reviewed:: Yes Do you feel safe going back to the place where you live?: Yes      Need for Family Participation in Patient Care: No (Comment) Care giver support system in place?: Yes (comment)   Criminal Activity/Legal Involvement Pertinent to Current Situation/Hospitalization: No - Comment as needed  Activities of Daily Living   ADL Screening (condition at time of admission) Independently performs ADLs?: Yes (appropriate for developmental age) Is the patient deaf or have difficulty hearing?: No Does the patient have difficulty seeing, even when wearing glasses/contacts?: No Does the patient have difficulty concentrating, remembering, or making decisions?: No  Permission Sought/Granted Permission sought to share information with : Family Supports    Share Information with NAME: Beth Valdez     Permission granted to share info w Relationship: Mother  Permission granted to share info w Contact Information: [phone]  Emotional Assessment Appearance:: Appears stated age Attitude/Demeanor/Rapport: Engaged Affect (typically observed): Pleasant Orientation: : Oriented to Self, Oriented to Place,  Oriented to  Time, Oriented to Situation Alcohol / Substance Use: Not Applicable Psych Involvement: No (comment)  Admission diagnosis:  Intractable nausea and vomiting [R11.2] Patient Active Problem List   Diagnosis Date Noted   Alpha thalassemia silent carrier 02/04/2024   Carrier of spinal muscular atrophy 02/04/2024   Intractable nausea and vomiting 02/04/2024   [redacted] weeks gestation of pregnancy 02/04/2024   Elevated LFTs 02/04/2024   Prediabetes in mother during pregnancy 01/18/2024   History of gestational diabetes 01/03/2024   Supervision of other normal pregnancy, antepartum 01/03/2024   PCP:  [facility] Pharmacy:   [id number] - [place], [place] - [address] AT [address] [place] [address] Phone: [phone] Fax: [phone]  [id number] [place], [place] - [address] AT [facility] [address] [place] [address] Phone: [phone] Fax: [phone]  [id number] - [place],  - [address] [place] [address] Phone: [phone] Fax: [phone]  [facility] - [place], [place] - [address] [place] [address] Phone: [phone] Fax: [phone]     Social Drivers of Health (SDOH) Social History: SDOH Screenings   Food Insecurity: No Food Insecurity (02/04/2024)  Housing: Unknown (02/05/2024)  Transportation Needs: No Transportation Needs (02/04/2024)  Utilities: Not At Risk (02/04/2024)  Depression (PHQ2-9):  Medium Risk (01/17/2024)  Social Connections: Unknown (02/04/2024)  Tobacco Use: Medium Risk (02/04/2024)   SDOH Interventions:     Readmission Risk Interventions     No data to display

## 2024-02-07 NOTE — Plan of Care (Signed)

## 2024-02-08 DIAGNOSIS — R112 Nausea with vomiting, unspecified: Secondary | ICD-10-CM | POA: Diagnosis not present

## 2024-02-08 DIAGNOSIS — Z3A13 13 weeks gestation of pregnancy: Secondary | ICD-10-CM | POA: Diagnosis not present

## 2024-02-08 DIAGNOSIS — R7989 Other specified abnormal findings of blood chemistry: Secondary | ICD-10-CM | POA: Diagnosis not present

## 2024-02-08 LAB — COMPREHENSIVE METABOLIC PANEL WITH GFR
ALT: 226 U/L — ABNORMAL HIGH (ref 0–44)
AST: 87 U/L — ABNORMAL HIGH (ref 15–41)
Albumin: 3.2 g/dL — ABNORMAL LOW (ref 3.5–5.0)
Alkaline Phosphatase: 84 U/L (ref 38–126)
Anion gap: 9 (ref 5–15)
BUN: <5 mg/dL — ABNORMAL LOW (ref 6–20)
CO2: 21 mmol/L — ABNORMAL LOW (ref 22–32)
Calcium: 8.3 mg/dL — ABNORMAL LOW (ref 8.9–10.3)
Chloride: 105 mmol/L (ref 98–111)
Creatinine, Ser: 0.36 mg/dL — ABNORMAL LOW (ref 0.44–1.00)
GFR, Estimated: >60 mL/min
Glucose, Bld: 92 mg/dL (ref 70–99)
Potassium: 3.1 mmol/L — ABNORMAL LOW (ref 3.5–5.1)
Sodium: 135 mmol/L (ref 135–145)
Total Bilirubin: 1.2 mg/dL (ref 0.0–1.2)
Total Protein: 5.5 g/dL — ABNORMAL LOW (ref 6.5–8.1)

## 2024-02-08 LAB — MAGNESIUM: Magnesium: 1.7 mg/dL (ref 1.7–2.4)

## 2024-02-08 LAB — LIPASE, BLOOD: Lipase: 203 U/L — ABNORMAL HIGH (ref 11–51)

## 2024-02-08 MED ORDER — FAMOTIDINE IN NACL 20-0.9 MG/50ML-% IV SOLN
20.0000 mg | Freq: Two times a day (BID) | INTRAVENOUS | Status: DC
  Filled 2024-02-08 (×2): qty 50

## 2024-02-08 MED ORDER — FAMOTIDINE 20 MG PO TABS
20.0000 mg | ORAL_TABLET | Freq: Two times a day (BID) | ORAL | Status: DC
  Administered 2024-02-08 – 2024-02-09 (×2): 20 mg via ORAL
  Filled 2024-02-08 (×2): qty 1

## 2024-02-08 MED ORDER — PANTOPRAZOLE SODIUM 40 MG PO TBEC
40.0000 mg | DELAYED_RELEASE_TABLET | Freq: Every day | ORAL | Status: DC
  Administered 2024-02-09: 40 mg via ORAL
  Filled 2024-02-08: qty 1

## 2024-02-08 MED ORDER — PANTOPRAZOLE SODIUM 40 MG IV SOLR
40.0000 mg | Freq: Every day | INTRAVENOUS | Status: DC
  Filled 2024-02-08: qty 10

## 2024-02-08 MED ORDER — POTASSIUM CHLORIDE 10 MEQ/100ML IV SOLN
10.0000 meq | INTRAVENOUS | Status: AC
  Administered 2024-02-08 (×2): 10 meq via INTRAVENOUS
  Filled 2024-02-08 (×2): qty 100

## 2024-02-08 MED ORDER — PRENATAL MULTIVITAMIN CH
1.0000 | ORAL_TABLET | Freq: Every day | ORAL | Status: DC
  Administered 2024-02-08: 1 via ORAL
  Filled 2024-02-08 (×2): qty 1

## 2024-02-08 NOTE — Progress Notes (Signed)
 "  Progress Note     Subjective: Patient denies pain. Reports minimal amount of nausea. Denies vomiting. Has not had BM. Reports flatulence. Tolerating FLD.  ROS  All negative with the exception of above.  Objective: Vital signs in last 24 hours: Temp:  [97.9 F (36.6 C)-98.7 F (37.1 C)] 98.7 F (37.1 C) (12/23 0542) Pulse Rate:  [76-84] 76 (12/23 0542) Resp:  [17-18] 17 (12/23 0542) BP: (103-110)/(51-60) 110/54 (12/23 0542) SpO2:  [100 %] 100 % (12/23 0542) Last BM Date : 02/04/24  Intake/Output from previous day: 12/22 0701 - 12/23 0700 In: 1993.1 [P.O.:840; I.V.:460.8; IV Piggyback:692.3] Out: -  Intake/Output this shift: No intake/output data recorded.  PE: General: Pleasant female who is laying in bed in NAD. HEENT: Head is normocephalic, atraumatic. Heart: HR normal during encounter.  Lungs: Respiratory effort nonlabored on room air.  Abd: Soft, NT, ND. +BS. No rebound tenderness or guarding.  MS: Able to move all 4 extremities.  Skin: Warm and dry.  Psych: A&Ox3 with an appropriate affect.    Lab Results:  Recent Labs    02/06/24 0503  WBC 7.9  HGB 9.7*  HCT 29.0*  PLT 190   BMET Recent Labs    02/07/24 0449 02/08/24 0408  NA 133* 135  K 2.9* 3.1*  CL 104 105  CO2 17* 21*  GLUCOSE 75 92  BUN <5* <5*  CREATININE 0.32* 0.36*  CALCIUM 8.4* 8.3*   PT/INR No results for input(s): LABPROT, INR in the last 72 hours. CMP     Component Value Date/Time   NA 135 02/08/2024 0408   NA 138 05/12/2018 1153   K 3.1 (L) 02/08/2024 0408   CL 105 02/08/2024 0408   CO2 21 (L) 02/08/2024 0408   GLUCOSE 92 02/08/2024 0408   BUN <5 (L) 02/08/2024 0408   BUN 4 (L) 05/12/2018 1153   CREATININE 0.36 (L) 02/08/2024 0408   CALCIUM 8.3 (L) 02/08/2024 0408   PROT 5.5 (L) 02/08/2024 0408   PROT 6.2 05/12/2018 1153   ALBUMIN 3.2 (L) 02/08/2024 0408   ALBUMIN 3.7 (L) 05/12/2018 1153   AST 87 (H) 02/08/2024 0408   ALT 226 (H) 02/08/2024 0408   ALKPHOS 84  02/08/2024 0408   BILITOT 1.2 02/08/2024 0408   BILITOT 0.2 05/12/2018 1153   GFRNONAA >60 02/08/2024 0408   GFRAA >60 03/21/2019 1407   Lipase     Component Value Date/Time   LIPASE 203 (H) 02/08/2024 0408       Studies/Results: No results found.  Anti-infectives: Anti-infectives (From admission, onward)    None        Assessment/Plan 23 yo female at [redacted]wks gestation, presenting with several weeks of nausea and vomiting with mildly elevated LFTs and lipase -RUS showed gallbladder sludge, but no stones.  -MRCP showed no choledocholithiasis or CBD dilation. -Afebrile. -LFTS: AST 87 from 99, ALT 226 from 221, Alk phos 84 from 82, Tbili 1.2 from 1.4 -Patient continues to have no abdominal pain. Tolerating FLD. Has had minimal nausea. No emesis. Having flatulence.  -Other than nausea, patient has no symptoms consistent with biliary colic. Do not recommend cholecystectomy at this time.  -Recommend OBGYN input regarding patient symptoms and labs and if this could be related to pregnancy.  -Had discussion with patient. Will plan for patient to follow up outpatient. This has already been arranged. Will discuss at that time need for elective cholecystectomy. Patient agrees. -Can advance diet as tolerated.  -General surgery will sign off at  this time. Please reach out for further questions and concerns.   FEN: FLD and can advance diet as tolerated; IVF per primary team VTE: SCDs ID: None currently    LOS: 3 days   I reviewed specialist notes, nursing notes, hospitalist notes, last 24 h vitals and pain scores, last 48 h intake and output, last 24 h labs and trends, and last 24 h imaging results.  This care required moderate level of medical decision making.   Marjorie Carlyon Favre, [facility] Surgery 02/08/2024, 9:53 AM Please see Amion for pager number during day hours 7:00am-4:30pm  "

## 2024-02-08 NOTE — Progress Notes (Signed)
 "          [facility]                                                                              Beth Valdez, is a 23 y.o. female, DOB - Aug 04, 2000, FMW:[id number] Admit date - 02/04/2024    Outpatient Primary MD for the patient is [facility]  LOS - 3  days  Chief Complaint  Patient presents with   Shortness of Breath   Nausea   Emesis       Brief summary   Patient is a 23 year old female, [redacted] weeks pregnant and presented with intractable nausea and vomiting.  Reports that she has been having persistent nausea and vomiting for the last 4 weeks and worse over the last couple of days with green/brown emesis.  No bloody emesis.  Also reported significant burning heartburn in the upper abdomen. Labs showed WBCs 11.3, AST 108, ALT 207, alk phos 123, total bilirubin 3.0, lipase 120, amylase 133 RUQ US  showed distended gallbladder with biliary sludge, no cholelithiasis. Patient was admitted to medicine service for likely pancreatitis   Assessment & Plan       Intractable nausea and vomiting, transaminitis, acute pancreatitis vs hyperemesis gravidarum -Presented with intractable nausea and vomiting with transaminitis, elevated lipase -RUQ US  showed distended gallbladder with biliary sludge, no cholelithiasis. - MRCP abdomen showed no acute cholecystitis, choledocholithiasis or evidence of acute pancreatitis. - Reviewed GI recommendations, signed off -Surgery following per Dr. Dasie today, has gallbladder sludge but other than that no other symptoms consistent with biliary colic, clinically does not have pancreatitis, no abdominal pain, not recommending cholecystectomy at this time.  Requested input from OB, requested OB to reevaluate on 12/22. -  Hyperemesis gravidarum commonly causes elevated liver enzymes due to severe dehydration/malnutrition.  - Hepatitis panel negative - Per OB/ Dr Winton Felt secure chat message yesterday evening, OB will  see today   Dehydration, mild hypercalcemia - Continue full liquid diet, advance as tolerated  Severe hypokalemia, hypomagnesemia - Placed on IV replacement    [redacted] weeks gestation of pregnancy - Continue to follow-up with OB/GYN, had first prenatal visit on 12/1.  Evaluated by Dr. Zina on 12/20 - Requested OB to reevaluate, awaiting  GERD - placed on Protonix    Estimated body mass index is 28.57 kg/m as calculated from the following:   Height as of this encounter: 5' 7 (1.702 m).   Weight as of this encounter: 82.7 kg.  Code Status: Full code DVT Prophylaxis:  SCDs Start: 02/04/24 2243   Level of Care: Level of care: Med-Surg Family Communication: Updated patient's significant other at the bedside Disposition Plan:      Remains inpatient appropriate:   Hopefully within next 24 to 48 hours if continues to improve and tolerating solid diet   Procedures:    Consultants:   Gastroenterology General Surgery OB  Antimicrobials:   Anti-infectives (From admission, onward)    None          Medications  pantoprazole  (PROTONIX ) IV  40 mg Intravenous Daily   pyridOXINE   100 mg Intravenous Daily   scopolamine   1 patch Transdermal Once  Subjective:   Beth Valdez was seen and examined today.  States feeling somewhat better today, no acute vomiting, chest pain, shortness of breath, fevers or chills.  No abdominal pain.  Objective:   Vitals:   02/07/24 1816 02/07/24 2134 02/08/24 0542 02/08/24 1039  BP: (!) 104/56 (!) 103/51 (!) 110/54 (!) 103/52  Pulse: 84 79 76 78  Resp: 18 17 17 18   Temp: 98.4 F (36.9 C) 97.9 F (36.6 C) 98.7 F (37.1 C) 98.6 F (37 C)  TempSrc: Oral Oral Oral Oral  SpO2: 100% 100% 100% 100%  Weight:      Height:        Intake/Output Summary (Last 24 hours) at 02/08/2024 1103 Last data filed at 02/08/2024 0352 Gross per 24 hour  Intake 1993.14 ml  Output --  Net 1993.14 ml     Wt Readings from Last 3 Encounters:   02/04/24 82.7 kg  01/17/24 90.3 kg  01/03/24 92.5 kg    Physical Exam General: Alert and oriented x 3, NAD Cardiovascular: S1 S2 clear, RRR.  Respiratory: CTAB, no wheezing Gastrointestinal: Soft, nontender, nondistended, NBS Ext: no pedal edema bilaterally Neuro: no new deficits Psych: Normal affect        Data Reviewed:  I have personally reviewed following labs    CBC Lab Results  Component Value Date   WBC 7.9 02/06/2024   RBC 4.37 02/06/2024   HGB 9.7 (L) 02/06/2024   HCT 29.0 (L) 02/06/2024   MCV 66.4 (L) 02/06/2024   MCH 22.2 (L) 02/06/2024   PLT 190 02/06/2024   MCHC 33.4 02/06/2024   RDW 16.6 (H) 02/06/2024   LYMPHSABS 1.5 01/17/2024   MONOABS 0.7 07/18/2022   EOSABS 0.0 01/17/2024   BASOSABS 0.0 01/17/2024     Last metabolic panel Lab Results  Component Value Date   NA 135 02/08/2024   K 3.1 (L) 02/08/2024   CL 105 02/08/2024   CO2 21 (L) 02/08/2024   BUN <5 (L) 02/08/2024   CREATININE 0.36 (L) 02/08/2024   GLUCOSE 92 02/08/2024   GFRNONAA >60 02/08/2024   GFRAA >60 03/21/2019   CALCIUM 8.3 (L) 02/08/2024   PHOS 3.1 02/07/2024   PROT 5.5 (L) 02/08/2024   ALBUMIN 3.2 (L) 02/08/2024   LABGLOB 2.5 05/12/2018   AGRATIO 1.5 05/12/2018   BILITOT 1.2 02/08/2024   ALKPHOS 84 02/08/2024   AST 87 (H) 02/08/2024   ALT 226 (H) 02/08/2024   ANIONGAP 9 02/08/2024    CBG (last 3)  No results for input(s): GLUCAP in the last 72 hours.    Coagulation Profile: No results for input(s): INR, PROTIME in the last 168 hours.   Radiology Studies: I have personally reviewed the imaging studies  No results found.      Nydia Distance M.D. [facility] 02/08/2024, 11:03 AM  Available via Epic secure chat 7am-7pm After 7 pm, please refer to night coverage provider listed on amion.    "

## 2024-02-08 NOTE — Plan of Care (Signed)

## 2024-02-08 NOTE — Plan of Care (Signed)

## 2024-02-08 NOTE — Progress Notes (Signed)
 FACULTY PRACTICE ANTEPARTUM(COMPREHENSIVE) NOTE  Beth Valdez is a 23 y.o. [id number] with Estimated Date of Delivery: 08/11/24   By  LMP [redacted]w[redacted]d  who is admitted for intractable nausea and vomiting which is likely hyperemesis gravidarum versus acute pancreatitis.      Subjective: Patient notes that the medicine has been working well, she states that it has been more than 24 hours since her last episode of emesis.  She has been tolerating full clears without issues.  She reports only some mild nausea with taking some of the medication.  Denies fevers or chills.  Denies abdominal pain or vaginal bleeding.  Reports no acute concerns. Patient notes that she has not had a BM since hospitalization, but states she has also been minimal solid foods  Vitals:  Blood pressure (!) 103/52, pulse 78, temperature 98.6 F (37 C), temperature source Oral, resp. rate 18, height 5' 7 (1.702 m), weight 82.7 kg, last menstrual period 11/05/2023, SpO2 100%. Vitals:   02/07/24 1816 02/07/24 2134 02/08/24 0542 02/08/24 1039  BP: (!) 104/56 (!) 103/51 (!) 110/54 (!) 103/52  Pulse: 84 79 76 78  Resp: 18 17 17 18   Temp: 98.4 F (36.9 C) 97.9 F (36.6 C) 98.7 F (37.1 C) 98.6 F (37 C)  TempSrc: Oral Oral Oral Oral  SpO2: 100% 100% 100% 100%  Weight:      Height:       Physical Examination:  General appearance - alert, well appearing, and in no distress Mental status - normal mood, behavior, speech, dress, motor activity, and thought processes Chest - CTAB Heart - normal rate and regular rhythm Abdomen - soft and tender, no rebound or guarding Musculoskeletal - no calf tenderness bilaterally Extremities - no pedal edema noted Skin - warm and dry    Labs:  Results for orders placed or performed during the hospital encounter of 02/04/24 (from the past 24 hours)  Comprehensive metabolic panel   Collection Time: 02/08/24  4:08 AM  Result Value Ref Range   Sodium 135 135 - 145 mmol/L   Potassium 3.1 (L)  3.5 - 5.1 mmol/L   Chloride 105 98 - 111 mmol/L   CO2 21 (L) 22 - 32 mmol/L   Glucose, Bld 92 70 - 99 mg/dL   BUN <5 (L) 6 - 20 mg/dL   Creatinine, Ser 9.63 (L) 0.44 - 1.00 mg/dL   Calcium 8.3 (L) 8.9 - 10.3 mg/dL   Total Protein 5.5 (L) 6.5 - 8.1 g/dL   Albumin 3.2 (L) 3.5 - 5.0 g/dL   AST 87 (H) 15 - 41 U/L   ALT 226 (H) 0 - 44 U/L   Alkaline Phosphatase 84 38 - 126 U/L   Total Bilirubin 1.2 0.0 - 1.2 mg/dL   GFR, Estimated >39 >39 mL/min   Anion gap 9 5 - 15  Lipase, blood   Collection Time: 02/08/24  4:08 AM  Result Value Ref Range   Lipase 203 (H) 11 - 51 U/L  Magnesium    Collection Time: 02/08/24  4:08 AM  Result Value Ref Range   Magnesium  1.7 1.7 - 2.4 mg/dL    Imaging Studies:    MR ABDOMEN MRCP WO CONTRAST Result Date: 02/05/2024 EXAM: MRCP WITHOUT IV CONTRAST 02/05/2024 05:13:30 PM TECHNIQUE: Multisequence, multiplanar magnetic resonance images of the abdomen without intravenous contrast. MRCP sequences were performed. COMPARISON: Ultrasound of 1 day prior. CLINICAL HISTORY: Cholelithiasis; [id number] Cholelithiasis [phone]; Transaminitis with elevated lipase, possible gallstone pancreatitis, need to rule out if acute cholecystitis. FINDINGS:  LIVER: Normal, without mass or intrahepatic biliary duct dilatation. GALLBLADDER AND BILIARY SYSTEM: Gallbladder is unremarkable. No gallstones. No pericholecystic edema. No choledocholithiasis. Normal common duct caliber, 3 mm. No intrahepatic or extrahepatic ductal dilation. SPLEEN: Normal in size and morphology. PANCREAS/PANCREATIC DUCT: Normal, without duct dilatation or acute inflammation. ADRENAL GLANDS: Unremarkable. KIDNEYS: No renal mass or hydronephrosis. LYMPH NODES: No enlarged abdominal lymph nodes. VASCULATURE: Unremarkable. PERITONEUM: No ascites. ABDOMINAL WALL: No hernia. No mass. BOWEL: Normal stomach, without wall thickening. Grossly normal bowel loops. No bowel obstruction. BONES: No acute abnormality or worrisome osseous  lesion. SOFT TISSUES: Unremarkable. MISCELLANEOUS: Normal heart size. No pleural fluid. IMPRESSION: 1. No acute cholecystitis or choledocholithiasis. 2. No evidence of acute pancreatitis. Electronically signed by: Rockey Kilts MD 02/05/2024 05:28 PM EST RP Workstation: [id number]   MR 3D Recon At Scanner Result Date: 02/05/2024 EXAM: MRCP WITHOUT IV CONTRAST 02/05/2024 05:13:30 PM TECHNIQUE: Multisequence, multiplanar magnetic resonance images of the abdomen without intravenous contrast. MRCP sequences were performed. COMPARISON: Ultrasound of 1 day prior. CLINICAL HISTORY: Cholelithiasis; [id number] Cholelithiasis [phone]; Transaminitis with elevated lipase, possible gallstone pancreatitis, need to rule out if acute cholecystitis. FINDINGS: LIVER: Normal, without mass or intrahepatic biliary duct dilatation. GALLBLADDER AND BILIARY SYSTEM: Gallbladder is unremarkable. No gallstones. No pericholecystic edema. No choledocholithiasis. Normal common duct caliber, 3 mm. No intrahepatic or extrahepatic ductal dilation. SPLEEN: Normal in size and morphology. PANCREAS/PANCREATIC DUCT: Normal, without duct dilatation or acute inflammation. ADRENAL GLANDS: Unremarkable. KIDNEYS: No renal mass or hydronephrosis. LYMPH NODES: No enlarged abdominal lymph nodes. VASCULATURE: Unremarkable. PERITONEUM: No ascites. ABDOMINAL WALL: No hernia. No mass. BOWEL: Normal stomach, without wall thickening. Grossly normal bowel loops. No bowel obstruction. BONES: No acute abnormality or worrisome osseous lesion. SOFT TISSUES: Unremarkable. MISCELLANEOUS: Normal heart size. No pleural fluid. IMPRESSION: 1. No acute cholecystitis or choledocholithiasis. 2. No evidence of acute pancreatitis. Electronically signed by: Rockey Kilts MD 02/05/2024 05:28 PM EST RP Workstation: [id number]   US  Abdomen Limited RUQ (LIVER/GB) Result Date: 02/04/2024 EXAM: Right Upper Quadrant Abdominal Ultrasound 02/04/2024 05:41:32 PM TECHNIQUE: Real-time  ultrasonography of the right upper quadrant of the abdomen was performed. COMPARISON: 07/18/2022 CLINICAL HISTORY: Nausea \\T \ vomiting; Elevated liver enzymes. FINDINGS: LIVER: Normal echogenicity. No intrahepatic biliary ductal dilatation. No evidence of mass. Hepatopetal flow in the portal vein. BILIARY SYSTEM: The gallbladder is distended and full of biliary sludge. No shadowing gallstones visualized. No pericholecystic fluid or wall thickening. The common bile duct measures 4 mm. RIGHT KIDNEY: No hydronephrosis. No echogenic calculi. No mass. OTHER: No right upper quadrant ascites. IMPRESSION: 1. Distended gallbladder filled with biliary sludge. No shadowing cholecystolithiasis or changes of acute cholecystitis. Electronically signed by: Rogelia Myers MD 02/04/2024 06:23 PM EST RP Workstation: [id number]     Medications:  Scheduled  pantoprazole  (PROTONIX ) IV  40 mg Intravenous Daily   pyridOXINE   100 mg Intravenous Daily   scopolamine   1 patch Transdermal Once   I have reviewed the patient's current medications.  ASSESSMENT: [id number] [redacted]w[redacted]d Estimated Date of Delivery: 08/11/24  Patient Active Problem List   Diagnosis Date Noted   Hypokalemia 02/07/2024   Hypomagnesemia 02/07/2024   Alpha thalassemia silent carrier 02/04/2024   Carrier of spinal muscular atrophy 02/04/2024   Intractable nausea and vomiting 02/04/2024   [redacted] weeks gestation of pregnancy 02/04/2024   Elevated LFTs 02/04/2024   Prediabetes in mother during pregnancy 01/18/2024   History of gestational diabetes 01/03/2024   Supervision of other normal pregnancy, antepartum 01/03/2024    PLAN: 1) Hyperemesis gravidarum - Doing  better with current medication - Would recommend advancing diet as tolerated - Once tolerating regular diet, would transition IV antiemetics to oral - Continue electrolyte repletion as needed  2) Constipation - Okay for Colace, Metamucil, MiraLAX  or any suppository - Encourage adequate  hydration    - Appreciate management per PCP team. Please call [phone] [facility] Consult Attending Monday-Friday 8am - 5pm) or [phone] [facility] Attending On Call all day, every day) for any obstetric concerns at any time.  Thank you for involving us  in the care of this patient.  Hideo Googe, DO Attending Obstetrician & Gynecologist, [facility] for [employer], [facility]

## 2024-02-09 ENCOUNTER — Other Ambulatory Visit (HOSPITAL_COMMUNITY): Payer: Self-pay

## 2024-02-09 LAB — CBC
HCT: 29.5 % — ABNORMAL LOW (ref 36.0–46.0)
Hemoglobin: 10 g/dL — ABNORMAL LOW (ref 12.0–15.0)
MCH: 22.3 pg — ABNORMAL LOW (ref 26.0–34.0)
MCHC: 33.9 g/dL (ref 30.0–36.0)
MCV: 65.8 fL — ABNORMAL LOW (ref 80.0–100.0)
Platelets: 204 K/uL (ref 150–400)
RBC: 4.48 MIL/uL (ref 3.87–5.11)
RDW: 16.7 % — ABNORMAL HIGH (ref 11.5–15.5)
WBC: 8.5 K/uL (ref 4.0–10.5)
nRBC: 0 % (ref 0.0–0.2)

## 2024-02-09 LAB — COMPREHENSIVE METABOLIC PANEL WITH GFR
ALT: 229 U/L — ABNORMAL HIGH (ref 0–44)
AST: 78 U/L — ABNORMAL HIGH (ref 15–41)
Albumin: 3.2 g/dL — ABNORMAL LOW (ref 3.5–5.0)
Alkaline Phosphatase: 87 U/L (ref 38–126)
Anion gap: 9 (ref 5–15)
BUN: <5 mg/dL — ABNORMAL LOW (ref 6–20)
CO2: 22 mmol/L (ref 22–32)
Calcium: 8.8 mg/dL — ABNORMAL LOW (ref 8.9–10.3)
Chloride: 104 mmol/L (ref 98–111)
Creatinine, Ser: 0.37 mg/dL — ABNORMAL LOW (ref 0.44–1.00)
GFR, Estimated: >60 mL/min
Glucose, Bld: 90 mg/dL (ref 70–99)
Potassium: 3.2 mmol/L — ABNORMAL LOW (ref 3.5–5.1)
Sodium: 135 mmol/L (ref 135–145)
Total Bilirubin: 1.1 mg/dL (ref 0.0–1.2)
Total Protein: 5.7 g/dL — ABNORMAL LOW (ref 6.5–8.1)

## 2024-02-09 MED ORDER — POTASSIUM CHLORIDE 10 MEQ/100ML IV SOLN
10.0000 meq | INTRAVENOUS | Status: AC
  Filled 2024-02-09 (×2): qty 100

## 2024-02-09 MED ORDER — ONDANSETRON 4 MG PO TBDP
4.0000 mg | ORAL_TABLET | Freq: Three times a day (TID) | ORAL | 0 refills | Status: AC | PRN
  Filled 2024-02-09: qty 30, 5d supply, fill #0

## 2024-02-09 MED ORDER — DOCUSATE SODIUM 100 MG PO CAPS
100.0000 mg | ORAL_CAPSULE | Freq: Every day | ORAL | 0 refills | Status: AC | PRN
  Filled 2024-02-09: qty 30, 30d supply, fill #0

## 2024-02-09 MED ORDER — POTASSIUM CHLORIDE CRYS ER 20 MEQ PO TBCR
40.0000 meq | EXTENDED_RELEASE_TABLET | Freq: Once | ORAL | Status: AC
  Administered 2024-02-09: 40 meq via ORAL
  Filled 2024-02-09: qty 2

## 2024-02-09 MED ORDER — FAMOTIDINE 20 MG PO TABS
20.0000 mg | ORAL_TABLET | Freq: Two times a day (BID) | ORAL | 0 refills | Status: AC | PRN
  Filled 2024-02-09: qty 90, 45d supply, fill #0

## 2024-02-09 NOTE — Progress Notes (Signed)
"   Verta Blush to be D/C'd  per MD order.  Discussed with the patient and all questions fully answered.  VSS, Skin clean, dry and intact without evidence of skin break down, no evidence of skin tears noted.  IV catheter discontinued intact. Site without signs and symptoms of complications. Dressing and pressure applied.  An After Visit Summary was printed and given to the patient. Patient instructed to pick up prescriptions from TOC. Patient's mother at bedside and agreed to pick up prescriptions before leaving.   D/c education completed with patient/family including follow up instructions, medication list, d/c activities limitations if indicated, with other d/c instructions as indicated by MD - patient able to verbalize understanding, all questions fully answered.   Patient instructed to return to ED, call 911, or call MD for any changes in condition.   Patient refused WC and independently walked with mother, and D/C home via private auto. "

## 2024-02-09 NOTE — Plan of Care (Signed)
" °  Problem: Education: Goal: Knowledge of General Education information will improve Description: Including pain rating scale, medication(s)/side effects and non-pharmacologic comfort measures Outcome: Progressing   Problem: Health Behavior/Discharge Planning: Goal: Ability to manage health-related needs will improve Outcome: Progressing   Problem: Activity: Goal: Risk for activity intolerance will decrease Outcome: Progressing   Problem: Nutrition: Goal: Adequate nutrition will be maintained Outcome: Progressing   Problem: Elimination: Goal: Will not experience complications related to urinary retention Outcome: Progressing   Problem: Safety: Goal: Ability to remain free from injury will improve Outcome: Progressing   "

## 2024-02-09 NOTE — Plan of Care (Signed)

## 2024-02-09 NOTE — Discharge Summary (Signed)
 Physician Discharge Summary  Beth Valdez FMW:[id number] DOB: May 28, 2000 DOA: 02/04/2024  PCP: [facility]  Admit date: 02/04/2024 Discharge date: 02/09/2024 Recommendations for Outpatient Follow-up:  Follow up with PCP in 1 weeks-call for appointment Please obtain BMP/CBC in one week Follow-up with your OB  Discharge Dispo: home Discharge Condition: Stable Code Status:   Code Status: Full Code Diet recommendation:  Diet Order             DIET SOFT Room service appropriate? Yes; Fluid consistency: Thin  Diet effective now                    Brief/Interim Summary: Beth Valdez is a 23 y.o. female  who is [redacted] weeks pregnant and is admitted with intractable nausea and vomiting and question of early/mild pancreatitis.Reports that she has been having persistent nausea and vomiting for the last 4 weeks and worse over the last couple of days with green/brown emesis.  No bloody emesis.  Also reported significant burning heartburn in the upper abdomen. Labs showed WBCs 11.3, AST 108, ALT 207, alk phos 123, total bilirubin 3.0, lipase 120, amylase 133 RUQ US  showed distended gallbladder with biliary sludge, no cholelithiasis Patient seen by OB/GYN GI and general surgery. MRCP abdomen showed no acute cholecystitis, choledocholithiasis or evidence of acute pancreatitis  Seen by GI OB/GYN surgery. Treated for hyperemesis gravidarum with improvement in the symptoms ambulating well tolerating diet and eager to go home today  Subjective: Seen and examined today Wants to go home today.  Tolerating diet Overnight on room air afebrile, VSS, Labs mild kalemia LFTs slightly downtrending AST CBC with stable anemia   Discharge Diagnoses:   Intractable nausea and vomiting, transaminitis, acute pancreatitis vs hyperemesis gravidarum: Extensive evaluation done for intractable nausea and vomiting with transaminitis, elevated lipase RUQ US  showed distended gallbladder  with biliary sludge, no cholelithiasis. MRCP abdomen showed no acute cholecystitis, choledocholithiasis or evidence of acute pancreatitis, hepatitis panel negative, at this time suspecting hyperemesis gravidarum as the presentation. GI signed off. Surgery  s/o - per Dr Dasie has gallbladder sludge but other than that no other symptoms consistent with biliary colic,not recommending cholecystectomy at this time. Seen by OB agree with current recommendation diet ADHD, transition to IV antibiotics to p.o. as tolerated diet, replaced electrolytes   Dehydration mild hypercalcemia Hypokalemia-severe Hypomagnesemia: Electrolytes overall improving replaced potassium   [redacted] weeks gestation of pregnancy Continue to follow-up with OB/GYN, had first prenatal visit on 12/1.  Evaluated by Dr. Zina on 12/20 OB following   GERD Continue Pepcid   DVT prophylaxis: SCDs Start: 02/04/24 2243 Code Status:   Code Status: Full Code Family Communication: plan of care discussed with patient at bedside. Patient status is: Remains hospitalized because of severity of illness Level of care: Med-Surg   Dispo: The patient is from: home            Anticipated disposition: home Objective: Vitals last 24 hrs: Vitals:   02/08/24 1039 02/08/24 1753 02/08/24 2227 02/09/24 0455  BP: (!) 103/52 (!) 113/52 (!) 114/59 (!) 112/56  Pulse: 78 79 83 72  Resp: 18 18 16 15   Temp: 98.6 F (37 C) 98.2 F (36.8 C) 98 F (36.7 C) 98 F (36.7 C)  TempSrc: Oral  Oral Oral  SpO2: 100% 100% 100% 100%  Weight:      Height:        Physical Examination: General exam: alert awake, oriented, older than stated age HEENT:Oral mucosa moist, Ear/Nose  WNL grossly Respiratory system: Bilaterally clear BS,no use of accessory muscle Cardiovascular system: S1 & S2 +, No JVD. Gastrointestinal system: Abdomen soft,NT,ND, BS+ Nervous System: Alert, awake, moving all extremities,and following commands. Extremities: extremities warm, leg edema  neg Skin: Warm, no rashes MSK: Normal muscle bulk,tone, power     Consultation: See note.  Discharge Instructions  Discharge Instructions     Discharge instructions   Complete by: As directed    Follow-up with your OB Follow-up with PCP  Please call call MD or return to ER for similar or worsening recurring problem that brought you to hospital or if any fever,nausea/vomiting,abdominal pain, uncontrolled pain, chest pain,  shortness of breath or any other alarming symptoms.  Please follow-up your doctor as instructed in a week time and call the office for appointment.  Please avoid alcohol, smoking, or any other illicit substance and maintain healthy habits including taking your regular medications as prescribed.  You were cared for by a hospitalist during your hospital stay. If you have any questions about your discharge medications or the care you received while you were in the hospital after you are discharged, you can call the unit and ask to speak with the hospitalist on call if the hospitalist that took care of you is not available.  Once you are discharged, your primary care physician will handle any further medical issues. Please note that NO REFILLS for any discharge medications will be authorized once you are discharged, as it is imperative that you return to your primary care physician (or establish a relationship with a primary care physician if you do not have one) for your aftercare needs so that they can reassess your need for medications and monitor your lab values      Allergies as of 02/09/2024   No Known Allergies      Medication List     STOP taking these medications    aspirin  EC 81 MG tablet       TAKE these medications    docusate sodium  100 MG capsule Commonly known as: Colace Take 1 capsule (100 mg total) by mouth daily as needed.   famotidine  20 MG tablet Commonly known as: PEPCID  Take 1 tablet (20 mg total) by mouth 2 (two) times daily as  needed for heartburn or indigestion.   ondansetron  4 MG disintegrating tablet Commonly known as: ZOFRAN -ODT Take 1-2 tablets (4-8 mg total) by mouth every 8 (eight) hours as needed.   polyethylene glycol powder 17 GM/SCOOP powder Commonly known as: GLYCOLAX /MIRALAX  Take 17 g by mouth daily as needed.   PRENATAL VITAMIN PO Take 1 tablet by mouth daily.   promethazine  25 MG tablet Commonly known as: PHENERGAN  Take 1 tablet (25 mg total) by mouth every 6 (six) hours as needed for nausea or vomiting.   scopolamine  1 MG/3DAYS Commonly known as: TRANSDERM-SCOP Place 1 patch (1 mg total) onto the skin every 3 (three) days. What changed:  when to take this additional instructions        Follow-up Information     [facility] Follow up in 1 week(s).   Specialty: Pediatrics Contact information: [address] [place] [address] [phone]                Allergies[1]  The results of significant diagnostics from this hospitalization (including imaging, microbiology, ancillary and laboratory) are listed below for reference.    Microbiology: No results found for this or any previous visit (from the past  240 hours).  Procedures/Studies: MR ABDOMEN MRCP WO CONTRAST Result Date: 02/05/2024 EXAM: MRCP WITHOUT IV CONTRAST 02/05/2024 05:13:30 PM TECHNIQUE: Multisequence, multiplanar magnetic resonance images of the abdomen without intravenous contrast. MRCP sequences were performed. COMPARISON: Ultrasound of 1 day prior. CLINICAL HISTORY: Cholelithiasis; 358439 Cholelithiasis [phone]; Transaminitis with elevated lipase, possible gallstone pancreatitis, need to rule out if acute cholecystitis. FINDINGS: LIVER: Normal, without mass or intrahepatic biliary duct dilatation. GALLBLADDER AND BILIARY SYSTEM: Gallbladder is unremarkable. No gallstones. No pericholecystic edema. No choledocholithiasis. Normal common duct caliber, 3 mm. No intrahepatic or  extrahepatic ductal dilation. SPLEEN: Normal in size and morphology. PANCREAS/PANCREATIC DUCT: Normal, without duct dilatation or acute inflammation. ADRENAL GLANDS: Unremarkable. KIDNEYS: No renal mass or hydronephrosis. LYMPH NODES: No enlarged abdominal lymph nodes. VASCULATURE: Unremarkable. PERITONEUM: No ascites. ABDOMINAL WALL: No hernia. No mass. BOWEL: Normal stomach, without wall thickening. Grossly normal bowel loops. No bowel obstruction. BONES: No acute abnormality or worrisome osseous lesion. SOFT TISSUES: Unremarkable. MISCELLANEOUS: Normal heart size. No pleural fluid. IMPRESSION: 1. No acute cholecystitis or choledocholithiasis. 2. No evidence of acute pancreatitis. Electronically signed by: Rockey Kilts MD 02/05/2024 05:28 PM EST RP Workstation: [id number]   MR 3D Recon At Scanner Result Date: 02/05/2024 EXAM: MRCP WITHOUT IV CONTRAST 02/05/2024 05:13:30 PM TECHNIQUE: Multisequence, multiplanar magnetic resonance images of the abdomen without intravenous contrast. MRCP sequences were performed. COMPARISON: Ultrasound of 1 day prior. CLINICAL HISTORY: Cholelithiasis; 358439 Cholelithiasis [phone]; Transaminitis with elevated lipase, possible gallstone pancreatitis, need to rule out if acute cholecystitis. FINDINGS: LIVER: Normal, without mass or intrahepatic biliary duct dilatation. GALLBLADDER AND BILIARY SYSTEM: Gallbladder is unremarkable. No gallstones. No pericholecystic edema. No choledocholithiasis. Normal common duct caliber, 3 mm. No intrahepatic or extrahepatic ductal dilation. SPLEEN: Normal in size and morphology. PANCREAS/PANCREATIC DUCT: Normal, without duct dilatation or acute inflammation. ADRENAL GLANDS: Unremarkable. KIDNEYS: No renal mass or hydronephrosis. LYMPH NODES: No enlarged abdominal lymph nodes. VASCULATURE: Unremarkable. PERITONEUM: No ascites. ABDOMINAL WALL: No hernia. No mass. BOWEL: Normal stomach, without wall thickening. Grossly normal bowel loops. No bowel  obstruction. BONES: No acute abnormality or worrisome osseous lesion. SOFT TISSUES: Unremarkable. MISCELLANEOUS: Normal heart size. No pleural fluid. IMPRESSION: 1. No acute cholecystitis or choledocholithiasis. 2. No evidence of acute pancreatitis. Electronically signed by: Rockey Kilts MD 02/05/2024 05:28 PM EST RP Workstation: [id number]   US  Abdomen Limited RUQ (LIVER/GB) Result Date: 02/04/2024 EXAM: Right Upper Quadrant Abdominal Ultrasound 02/04/2024 05:41:32 PM TECHNIQUE: Real-time ultrasonography of the right upper quadrant of the abdomen was performed. COMPARISON: 07/18/2022 CLINICAL HISTORY: Nausea \\T \ vomiting; Elevated liver enzymes. FINDINGS: LIVER: Normal echogenicity. No intrahepatic biliary ductal dilatation. No evidence of mass. Hepatopetal flow in the portal vein. BILIARY SYSTEM: The gallbladder is distended and full of biliary sludge. No shadowing gallstones visualized. No pericholecystic fluid or wall thickening. The common bile duct measures 4 mm. RIGHT KIDNEY: No hydronephrosis. No echogenic calculi. No mass. OTHER: No right upper quadrant ascites. IMPRESSION: 1. Distended gallbladder filled with biliary sludge. No shadowing cholecystolithiasis or changes of acute cholecystitis. Electronically signed by: Rogelia Myers MD 02/04/2024 06:23 PM EST RP Workstation: [id number]    Labs: BNP (last 3 results) No results for input(s): BNP in the last 8760 hours. Basic Metabolic Panel: Recent Labs  Lab 02/05/24 0634 02/06/24 0503 02/07/24 0449 02/08/24 0408 02/09/24 0415  NA 135 134* 133* 135 135  K 3.2* 3.5 2.9* 3.1* 3.2*  CL 106 104 104 105 104  CO2 14* 17* 17* 21* 22  GLUCOSE 86 74 75 92 90  BUN <5* <  5* <5* <5* <5*  CREATININE 0.39* 0.42* 0.32* 0.36* 0.37*  CALCIUM 8.6* 8.5* 8.4* 8.3* 8.8*  MG  --   --  1.4* 1.7  --   PHOS  --   --  3.1  --   --    Liver Function Tests: Recent Labs  Lab 02/05/24 0634 02/06/24 0503 02/07/24 0449 02/08/24 0408 02/09/24 0415  AST  101* 106* 99* 87* 78*  ALT 183* 201* 221* 226* 229*  ALKPHOS 79 77 82 84 87  BILITOT 2.1* 1.8* 1.4* 1.2 1.1  PROT 5.6* 5.3* 5.5* 5.5* 5.7*  ALBUMIN 3.0* 3.0* 3.1* 3.2* 3.2*   Recent Labs  Lab 02/04/24 1536 02/05/24 0634 02/06/24 0503 02/07/24 0449 02/08/24 0408  LIPASE 120* 194* 222* 224* 203*  AMYLASE 133*  --   --   --   --    No results for input(s): AMMONIA in the last 168 hours. CBC: Recent Labs  Lab 02/04/24 1536 02/05/24 0634 02/06/24 0503 02/09/24 0415  WBC 11.3* 8.1 7.9 8.5  HGB 14.1 10.5* 9.7* 10.0*  HCT 42.8 31.4* 29.0* 29.5*  MCV 67.1* 66.5* 66.4* 65.8*  PLT 265 207 190 204   CBG: No results for input(s): GLUCAP in the last 168 hours. Hgb A1c No results for input(s): HGBA1C in the last 72 hours. Anemia work up No results for input(s): VITAMINB12, FOLATE, FERRITIN, TIBC, IRON , RETICCTPCT in the last 72 hours. Cardiac Enzymes: No results for input(s): CKTOTAL, CKMB, CKMBINDEX, TROPONINI in the last 168 hours. BNP: Invalid input(s): POCBNP D-Dimer No results for input(s): DDIMER in the last 72 hours. Lipid Profile No results for input(s): CHOL, HDL, LDLCALC, TRIG, CHOLHDL, LDLDIRECT in the last 72 hours. Thyroid function studies No results for input(s): TSH, T4TOTAL, T3FREE, THYROIDAB in the last 72 hours.  Invalid input(s): FREET3 Urinalysis    Component Value Date/Time   COLORURINE YELLOW 02/04/2024 1445   APPEARANCEUR CLEAR 02/04/2024 1445   LABSPEC >1.030 (H) 02/04/2024 1445   PHURINE 6.0 02/04/2024 1445   GLUCOSEU 100 (A) 02/04/2024 1445   HGBUR TRACE (A) 02/04/2024 1445   BILIRUBINUR MODERATE (A) 02/04/2024 1445   BILIRUBINUR negative 08/04/2023 1705   KETONESUR >80 (A) 02/04/2024 1445   PROTEINUR 100 (A) 02/04/2024 1445   UROBILINOGEN 1.0 08/04/2023 1705   UROBILINOGEN 0.2 02/20/2014 0710   NITRITE NEGATIVE 02/04/2024 1445   LEUKOCYTESUR TRACE (A) 02/04/2024 1445   Sepsis  Labs Recent Labs  Lab 02/04/24 1536 02/05/24 0634 02/06/24 0503 02/09/24 0415  WBC 11.3* 8.1 7.9 8.5   Microbiology No results found for this or any previous visit (from the past 240 hours).   Time coordinating discharge: 25 minutes  SIGNED: Mennie LAMY, MD  [facility] 02/09/2024, 10:19 AM  If 7PM-7AM, please contact night-coverage [email or web address]       [1] No Known Allergies

## 2024-02-14 ENCOUNTER — Ambulatory Visit (INDEPENDENT_AMBULATORY_CARE_PROVIDER_SITE_OTHER): Admitting: Obstetrics and Gynecology

## 2024-02-14 ENCOUNTER — Other Ambulatory Visit

## 2024-02-14 ENCOUNTER — Encounter: Payer: Self-pay | Admitting: Obstetrics and Gynecology

## 2024-02-14 VITALS — BP 110/70 | HR 86 | Wt 188.0 lb

## 2024-02-14 DIAGNOSIS — Z8632 Personal history of gestational diabetes: Secondary | ICD-10-CM

## 2024-02-14 DIAGNOSIS — Z3480 Encounter for supervision of other normal pregnancy, unspecified trimester: Secondary | ICD-10-CM

## 2024-02-14 DIAGNOSIS — Z3A14 14 weeks gestation of pregnancy: Secondary | ICD-10-CM | POA: Diagnosis not present

## 2024-02-14 DIAGNOSIS — Z3482 Encounter for supervision of other normal pregnancy, second trimester: Secondary | ICD-10-CM

## 2024-02-14 NOTE — Progress Notes (Signed)
 "  PRENATAL VISIT NOTE  Subjective:  Beth Valdez is a 23 y.o. [phone] at [redacted]w[redacted]d being seen today for ongoing prenatal care.  She is currently monitored for the following issues for this low-risk pregnancy and has History of gestational diabetes; Supervision of other normal pregnancy, antepartum; Prediabetes in mother during pregnancy; Alpha thalassemia silent carrier; Carrier of spinal muscular atrophy; Intractable nausea and vomiting; and Elevated LFTs on their problem list.  Patient reports no complaints.  Contractions: Not present. Vag. Bleeding: None.   . Denies leaking of fluid.   The following portions of the patient's history were reviewed and updated as appropriate: allergies, current medications, past family history, past medical history, past social history, past surgical history and problem list.   Objective:   Vitals:   02/14/24 0918  BP: 110/70  Pulse: 86  Weight: 188 lb (85.3 kg)    Fetal Status:  Fetal Heart Rate (bpm): 150        General: Alert, oriented and cooperative. Patient is in no acute distress.  Skin: Skin is warm and dry. No rash noted.   Cardiovascular: Normal heart rate noted  Respiratory: Normal respiratory effort, no problems with respiration noted  Abdomen: Soft, gravid, appropriate for gestational age.  Pain/Pressure: Absent     Pelvic: Cervical exam deferred        Extremities: Normal range of motion.  Edema: Trace  Mental Status: Normal mood and affect. Normal behavior. Normal judgment and thought content.      01/17/2024   10:27 AM 01/03/2024    4:13 PM 10/19/2019   10:50 AM  Depression screen PHQ 2/9  Decreased Interest 2 0 1  Down, Depressed, Hopeless 2 0 1  PHQ - 2 Score 4 0 2  Altered sleeping 0 0 3  Tired, decreased energy 3 0 1  Change in appetite 1 0 0  Feeling bad or failure about yourself  0 0 3  Trouble concentrating 0 0 0  Moving slowly or fidgety/restless 0 0 0  Suicidal thoughts 0 0 0  PHQ-9 Score 8 0 9      Data  saved with a previous flowsheet row definition        01/17/2024   10:28 AM 01/03/2024    4:14 PM 10/19/2019   10:50 AM 04/19/2018   10:41 AM  GAD 7 : Generalized Anxiety Score  Nervous, Anxious, on Edge 0 0 0 0  Control/stop worrying 0 0 0 0  Worry too much - different things 0 0 1 0  Trouble relaxing 2 0 1 0  Restless 0 0 1 0  Easily annoyed or irritable 2 0 1 2  Afraid - awful might happen 0 0 0 0  Total GAD 7 Score 4 0 4 2    Assessment and Plan:  Pregnancy: G4P1021 at [redacted]w[redacted]d 1. Supervision of other normal pregnancy, antepartum (Primary) Patient is doing well without complaints She reports significant improvement in her nausea and emesis since her recent hospital discharge.  Patient desires IUD but is considering BTL. She will discuss further with her partner Anatomy ultrasound scheduled 03/20/2024 - Glucose Tolerance, 2 Hours w/1 Hour  2. History of gestational diabetes Early glucola today - Glucose Tolerance, 2 Hours w/1 Hour  3. [redacted] weeks gestation of pregnancy   Preterm labor symptoms and general obstetric precautions including but not limited to vaginal bleeding, contractions, leaking of fluid and fetal movement were reviewed in detail with the patient. Please refer to After Visit Summary for other  counseling recommendations.   Return in about 4 weeks (around 03/13/2024) for in person, ROB, Low risk.  Future Appointments  Date Time Provider Department Center  03/20/2024  1:00 PM [facility] PROVIDER 1 [facility]  03/20/2024  1:30 PM [facility] US1 [facility]    Winton Felt, MD  "

## 2024-02-15 ENCOUNTER — Ambulatory Visit: Payer: Self-pay | Admitting: Obstetrics and Gynecology

## 2024-02-15 DIAGNOSIS — O24419 Gestational diabetes mellitus in pregnancy, unspecified control: Secondary | ICD-10-CM | POA: Insufficient documentation

## 2024-02-15 DIAGNOSIS — Z8632 Personal history of gestational diabetes: Secondary | ICD-10-CM

## 2024-02-15 LAB — GLUCOSE TOLERANCE, 2 HOURS W/ 1HR
Glucose, 1 hour: 134 mg/dL (ref 70–179)
Glucose, 2 hour: 122 mg/dL (ref 70–152)
Glucose, Fasting: 93 mg/dL — ABNORMAL HIGH (ref 70–91)

## 2024-02-15 MED ORDER — ACCU-CHEK GUIDE W/DEVICE KIT: 1.0000 | PACK | Freq: Four times a day (QID) | 0 refills | Status: AC

## 2024-02-15 MED ORDER — ACCU-CHEK SOFTCLIX LANCETS MISC: 100.0000 | Freq: Four times a day (QID) | 12 refills | Status: AC

## 2024-02-15 MED ORDER — ACCU-CHEK GUIDE TEST VI STRP: 1.0000 | ORAL_STRIP | Freq: Four times a day (QID) | 12 refills | Status: AC

## 2024-02-16 ENCOUNTER — Other Ambulatory Visit: Payer: Self-pay | Admitting: Obstetrics and Gynecology

## 2024-02-16 DIAGNOSIS — O24410 Gestational diabetes mellitus in pregnancy, diet controlled: Secondary | ICD-10-CM

## 2024-02-16 DIAGNOSIS — O24419 Gestational diabetes mellitus in pregnancy, unspecified control: Secondary | ICD-10-CM

## 2024-03-06 DIAGNOSIS — D563 Thalassemia minor: Secondary | ICD-10-CM | POA: Insufficient documentation

## 2024-03-06 DIAGNOSIS — O99210 Obesity complicating pregnancy, unspecified trimester: Secondary | ICD-10-CM | POA: Insufficient documentation

## 2024-03-13 ENCOUNTER — Encounter: Payer: Self-pay | Admitting: Obstetrics and Gynecology

## 2024-03-16 ENCOUNTER — Encounter: Payer: Self-pay | Admitting: Obstetrics and Gynecology

## 2024-03-16 ENCOUNTER — Ambulatory Visit: Admitting: Obstetrics and Gynecology

## 2024-03-16 VITALS — BP 120/73 | HR 90 | Wt 203.0 lb

## 2024-03-16 DIAGNOSIS — O99212 Obesity complicating pregnancy, second trimester: Secondary | ICD-10-CM | POA: Diagnosis not present

## 2024-03-16 DIAGNOSIS — D563 Thalassemia minor: Secondary | ICD-10-CM

## 2024-03-16 DIAGNOSIS — Z3A18 18 weeks gestation of pregnancy: Secondary | ICD-10-CM

## 2024-03-16 DIAGNOSIS — Z148 Genetic carrier of other disease: Secondary | ICD-10-CM

## 2024-03-16 DIAGNOSIS — O24410 Gestational diabetes mellitus in pregnancy, diet controlled: Secondary | ICD-10-CM

## 2024-03-16 DIAGNOSIS — O24419 Gestational diabetes mellitus in pregnancy, unspecified control: Secondary | ICD-10-CM

## 2024-03-16 DIAGNOSIS — O99210 Obesity complicating pregnancy, unspecified trimester: Secondary | ICD-10-CM

## 2024-03-16 DIAGNOSIS — Z3480 Encounter for supervision of other normal pregnancy, unspecified trimester: Secondary | ICD-10-CM

## 2024-03-16 MED ORDER — ASPIRIN 81 MG PO TBEC: 81.0000 mg | DELAYED_RELEASE_TABLET | Freq: Every day | ORAL | 2 refills | Status: AC

## 2024-03-16 NOTE — Progress Notes (Signed)
 "  PRENATAL VISIT NOTE  Subjective:  Beth Valdez is a 24 y.o. [phone] at [redacted]w[redacted]d being seen today for ongoing prenatal care.  She is currently monitored for the following issues for this high-risk pregnancy and has History of gestational diabetes; Supervision of other normal pregnancy, antepartum; Prediabetes in mother during pregnancy; Carrier of spinal muscular atrophy-^ risk; Intractable nausea and vomiting; Elevated LFTs; Gestational diabetes mellitus (GDM) affecting pregnancy, antepartum; Thalassemia alpha carrier; and Obesity affecting pregnancy, antepartum on their problem list.  Patient reports no complaints.  Contractions: Not present.  .  Movement: Present. Denies leaking of fluid.   The following portions of the patient's history were reviewed and updated as appropriate: allergies, current medications, past family history, past medical history, past social history, past surgical history and problem list.   Objective:   Vitals:   03/16/24 0859  BP: 120/73  Pulse: 90  Weight: 203 lb (92.1 kg)    Fetal Status:  Fetal Heart Rate (bpm): 154   Movement: Present    General: Alert, oriented and cooperative. Patient is in no acute distress.  Skin: Skin is warm and dry. No rash noted.   Cardiovascular: Normal heart rate noted  Respiratory: Normal respiratory effort, no problems with respiration noted  Abdomen: Soft, gravid, appropriate for gestational age.  Pain/Pressure: Absent     Pelvic: Cervical exam deferred        Extremities: Normal range of motion.  Edema: None  Mental Status: Normal mood and affect. Normal behavior. Normal judgment and thought content.      01/17/2024   10:27 AM 01/03/2024    4:13 PM 10/19/2019   10:50 AM  Depression screen PHQ 2/9  Decreased Interest 2 0 1  Down, Depressed, Hopeless 2 0 1  PHQ - 2 Score 4 0 2  Altered sleeping 0 0 3  Tired, decreased energy 3 0 1  Change in appetite 1 0 0  Feeling bad or failure about yourself  0 0 3   Trouble concentrating 0 0 0  Moving slowly or fidgety/restless 0 0 0  Suicidal thoughts 0 0 0  PHQ-9 Score 8 0 9      Data saved with a previous flowsheet row definition        01/17/2024   10:28 AM 01/03/2024    4:14 PM 10/19/2019   10:50 AM 04/19/2018   10:41 AM  GAD 7 : Generalized Anxiety Score  Nervous, Anxious, on Edge 0  0  0  0   Control/stop worrying 0  0  0  0   Worry too much - different things 0  0  1  0   Trouble relaxing 2  0  1  0   Restless 0  0  1  0   Easily annoyed or irritable 2  0  1  2   Afraid - awful might happen 0  0  0  0   Total GAD 7 Score 4 0 4 2     Data saved with a previous flowsheet row definition    Assessment and Plan:  Pregnancy: G4P1021 at [redacted]w[redacted]d 1. Gestational diabetes mellitus (GDM) in second trimester, gestational diabetes method of control unspecified (Primary) CBGs reviewed and all within range, diet control   2. [redacted] weeks gestation of pregnancy   3. Supervision of other normal pregnancy, antepartum PAtient is doing well reporting daily headaches not always relieved by tylenol . Patient referred to see headache specialist Darice Bonni Gaskins at our [facility] office  Follow up anatomy ultrasound Patient declined AFP  4. Obesity affecting pregnancy, antepartum, unspecified obesity type Lost 17 lb thus far with healthier habits Rx ASA provided  6. Carrier of spinal muscular atrophy-^ risk 7. Thalassemia alpha carrier Reviewed results with the patient Partner kit provided  Preterm labor symptoms and general obstetric precautions including but not limited to vaginal bleeding, contractions, leaking of fluid and fetal movement were reviewed in detail with the patient. Please refer to After Visit Summary for other counseling recommendations.   Return in about 4 weeks (around 04/13/2024) for in person, ROB, High risk.  Future Appointments  Date Time Provider Department Center  03/20/2024  1:00 PM [facility] PROVIDER 1 [facility]  03/20/2024  1:30  PM [facility] US1 [facility]  04/13/2024  1:50 PM Erik Kieth BROCKS, MD [facility] None    Winton Felt, MD  "

## 2024-03-20 ENCOUNTER — Ambulatory Visit

## 2024-03-20 ENCOUNTER — Other Ambulatory Visit

## 2024-03-20 DIAGNOSIS — D563 Thalassemia minor: Secondary | ICD-10-CM

## 2024-03-20 DIAGNOSIS — O99210 Obesity complicating pregnancy, unspecified trimester: Secondary | ICD-10-CM

## 2024-04-11 ENCOUNTER — Ambulatory Visit

## 2024-04-11 ENCOUNTER — Other Ambulatory Visit

## 2024-04-13 ENCOUNTER — Encounter: Payer: Self-pay | Admitting: Obstetrics and Gynecology
